# Patient Record
Sex: Female | Born: 1935 | Race: White | Hispanic: No | Smoking: Never smoker
Health system: Southern US, Community
[De-identification: ages and names within clinical notes are randomized; demographics above are authoritative.]

## PROBLEM LIST (undated history)

## (undated) DIAGNOSIS — I499 Cardiac arrhythmia, unspecified: Secondary | ICD-10-CM

## (undated) DIAGNOSIS — I4891 Unspecified atrial fibrillation: Secondary | ICD-10-CM

## (undated) DIAGNOSIS — T4145XA Adverse effect of unspecified anesthetic, initial encounter: Secondary | ICD-10-CM

## (undated) DIAGNOSIS — I272 Pulmonary hypertension, unspecified: Secondary | ICD-10-CM

## (undated) DIAGNOSIS — E785 Hyperlipidemia, unspecified: Secondary | ICD-10-CM

## (undated) DIAGNOSIS — I10 Essential (primary) hypertension: Secondary | ICD-10-CM

## (undated) DIAGNOSIS — I2510 Atherosclerotic heart disease of native coronary artery without angina pectoris: Secondary | ICD-10-CM

## (undated) DIAGNOSIS — F0390 Unspecified dementia without behavioral disturbance: Secondary | ICD-10-CM

## (undated) DIAGNOSIS — T8859XA Other complications of anesthesia, initial encounter: Secondary | ICD-10-CM

## (undated) HISTORY — PX: ORTHOPEDIC SURGERY: SHX850

## (undated) HISTORY — DX: Hyperlipidemia, unspecified: E78.5

## (undated) HISTORY — DX: Cardiac arrhythmia, unspecified: I49.9

## (undated) HISTORY — PX: ABDOMINAL HYSTERECTOMY: SHX81

## (undated) HISTORY — DX: Essential (primary) hypertension: I10

## (undated) HISTORY — DX: Atherosclerotic heart disease of native coronary artery without angina pectoris: I25.10

## (undated) HISTORY — PX: CORONARY ARTERY BYPASS GRAFT: SHX141

## (undated) HISTORY — DX: Unspecified atrial fibrillation: I48.91

---

## 1998-02-04 ENCOUNTER — Other Ambulatory Visit: Admission: RE | Admit: 1998-02-04 | Discharge: 1998-02-04 | Payer: Self-pay | Admitting: Obstetrics and Gynecology

## 1998-02-14 ENCOUNTER — Other Ambulatory Visit: Admission: RE | Admit: 1998-02-14 | Discharge: 1998-02-14 | Payer: Self-pay | Admitting: Cardiology

## 1998-02-22 ENCOUNTER — Ambulatory Visit (HOSPITAL_COMMUNITY): Admission: RE | Admit: 1998-02-22 | Discharge: 1998-02-22 | Payer: Self-pay | Admitting: Cardiology

## 1999-01-31 ENCOUNTER — Other Ambulatory Visit: Admission: RE | Admit: 1999-01-31 | Discharge: 1999-01-31 | Payer: Self-pay | Admitting: Obstetrics and Gynecology

## 1999-06-24 ENCOUNTER — Other Ambulatory Visit: Admission: RE | Admit: 1999-06-24 | Discharge: 1999-06-24 | Payer: Self-pay | Admitting: Obstetrics and Gynecology

## 1999-06-24 ENCOUNTER — Encounter (INDEPENDENT_AMBULATORY_CARE_PROVIDER_SITE_OTHER): Payer: Self-pay | Admitting: Specialist

## 2000-05-26 ENCOUNTER — Other Ambulatory Visit: Admission: RE | Admit: 2000-05-26 | Discharge: 2000-05-26 | Payer: Self-pay | Admitting: Obstetrics and Gynecology

## 2001-06-01 ENCOUNTER — Ambulatory Visit (HOSPITAL_COMMUNITY): Admission: RE | Admit: 2001-06-01 | Discharge: 2001-06-01 | Payer: Self-pay | Admitting: Cardiology

## 2001-12-26 ENCOUNTER — Other Ambulatory Visit: Admission: RE | Admit: 2001-12-26 | Discharge: 2001-12-26 | Payer: Self-pay | Admitting: Obstetrics and Gynecology

## 2002-01-10 ENCOUNTER — Encounter: Payer: Self-pay | Admitting: Obstetrics and Gynecology

## 2002-01-10 ENCOUNTER — Encounter: Admission: RE | Admit: 2002-01-10 | Discharge: 2002-01-10 | Payer: Self-pay | Admitting: Obstetrics and Gynecology

## 2004-01-07 ENCOUNTER — Other Ambulatory Visit: Admission: RE | Admit: 2004-01-07 | Discharge: 2004-01-07 | Payer: Self-pay | Admitting: Obstetrics and Gynecology

## 2004-01-10 ENCOUNTER — Encounter: Admission: RE | Admit: 2004-01-10 | Discharge: 2004-01-10 | Payer: Self-pay | Admitting: Obstetrics and Gynecology

## 2005-02-03 ENCOUNTER — Inpatient Hospital Stay (HOSPITAL_COMMUNITY): Admission: EM | Admit: 2005-02-03 | Discharge: 2005-02-11 | Payer: Self-pay | Admitting: Emergency Medicine

## 2005-02-03 ENCOUNTER — Encounter: Admission: RE | Admit: 2005-02-03 | Discharge: 2005-02-03 | Payer: Self-pay | Admitting: Cardiology

## 2005-04-13 ENCOUNTER — Encounter (HOSPITAL_COMMUNITY): Admission: RE | Admit: 2005-04-13 | Discharge: 2005-07-12 | Payer: Self-pay | Admitting: Cardiology

## 2005-07-13 ENCOUNTER — Encounter (HOSPITAL_COMMUNITY): Admission: RE | Admit: 2005-07-13 | Discharge: 2005-07-25 | Payer: Self-pay | Admitting: Cardiology

## 2007-01-18 ENCOUNTER — Other Ambulatory Visit: Admission: RE | Admit: 2007-01-18 | Discharge: 2007-01-18 | Payer: Self-pay | Admitting: Obstetrics and Gynecology

## 2007-02-17 ENCOUNTER — Inpatient Hospital Stay (HOSPITAL_COMMUNITY): Admission: EM | Admit: 2007-02-17 | Discharge: 2007-02-18 | Payer: Self-pay | Admitting: Emergency Medicine

## 2007-02-19 ENCOUNTER — Inpatient Hospital Stay (HOSPITAL_COMMUNITY): Admission: EM | Admit: 2007-02-19 | Discharge: 2007-02-23 | Payer: Self-pay | Admitting: Emergency Medicine

## 2007-02-20 ENCOUNTER — Encounter (INDEPENDENT_AMBULATORY_CARE_PROVIDER_SITE_OTHER): Payer: Self-pay | Admitting: Cardiology

## 2007-03-02 ENCOUNTER — Ambulatory Visit: Payer: Self-pay | Admitting: Vascular Surgery

## 2007-10-12 ENCOUNTER — Encounter: Payer: Self-pay | Admitting: Pulmonary Disease

## 2007-12-09 ENCOUNTER — Ambulatory Visit: Payer: Self-pay | Admitting: Pulmonary Disease

## 2007-12-09 DIAGNOSIS — R0602 Shortness of breath: Secondary | ICD-10-CM

## 2007-12-09 DIAGNOSIS — I10 Essential (primary) hypertension: Secondary | ICD-10-CM | POA: Insufficient documentation

## 2007-12-09 DIAGNOSIS — E785 Hyperlipidemia, unspecified: Secondary | ICD-10-CM

## 2007-12-16 ENCOUNTER — Encounter: Payer: Self-pay | Admitting: Pulmonary Disease

## 2007-12-22 ENCOUNTER — Ambulatory Visit (HOSPITAL_COMMUNITY): Admission: RE | Admit: 2007-12-22 | Discharge: 2007-12-22 | Payer: Self-pay | Admitting: Family Medicine

## 2007-12-22 ENCOUNTER — Encounter (INDEPENDENT_AMBULATORY_CARE_PROVIDER_SITE_OTHER): Payer: Self-pay | Admitting: Family Medicine

## 2007-12-22 ENCOUNTER — Ambulatory Visit: Payer: Self-pay | Admitting: Surgery

## 2007-12-23 ENCOUNTER — Ambulatory Visit: Payer: Self-pay | Admitting: Pulmonary Disease

## 2007-12-27 ENCOUNTER — Telehealth: Payer: Self-pay | Admitting: Pulmonary Disease

## 2007-12-28 DIAGNOSIS — I4891 Unspecified atrial fibrillation: Secondary | ICD-10-CM

## 2008-01-06 ENCOUNTER — Ambulatory Visit: Payer: Self-pay | Admitting: Pulmonary Disease

## 2008-03-14 ENCOUNTER — Ambulatory Visit: Payer: Self-pay | Admitting: Pulmonary Disease

## 2008-05-14 ENCOUNTER — Ambulatory Visit: Payer: Self-pay | Admitting: Pulmonary Disease

## 2008-05-17 ENCOUNTER — Telehealth (INDEPENDENT_AMBULATORY_CARE_PROVIDER_SITE_OTHER): Payer: Self-pay | Admitting: *Deleted

## 2008-06-06 ENCOUNTER — Ambulatory Visit: Payer: Self-pay | Admitting: Pulmonary Disease

## 2008-06-06 DIAGNOSIS — J4489 Other specified chronic obstructive pulmonary disease: Secondary | ICD-10-CM | POA: Insufficient documentation

## 2008-06-06 DIAGNOSIS — J449 Chronic obstructive pulmonary disease, unspecified: Secondary | ICD-10-CM

## 2008-12-13 ENCOUNTER — Inpatient Hospital Stay (HOSPITAL_COMMUNITY): Admission: EM | Admit: 2008-12-13 | Discharge: 2008-12-16 | Payer: Self-pay | Admitting: Emergency Medicine

## 2008-12-14 ENCOUNTER — Ambulatory Visit: Payer: Self-pay | Admitting: Vascular Surgery

## 2008-12-14 ENCOUNTER — Encounter (INDEPENDENT_AMBULATORY_CARE_PROVIDER_SITE_OTHER): Payer: Self-pay | Admitting: Internal Medicine

## 2010-11-15 ENCOUNTER — Encounter: Payer: Self-pay | Admitting: Orthopedic Surgery

## 2010-11-16 ENCOUNTER — Encounter: Payer: Self-pay | Admitting: Family Medicine

## 2011-02-10 LAB — DIFFERENTIAL
Basophils Absolute: 0 10*3/uL (ref 0.0–0.1)
Basophils Relative: 1 % (ref 0–1)
Eosinophils Absolute: 0.3 10*3/uL (ref 0.0–0.7)
Eosinophils Absolute: 0.4 10*3/uL (ref 0.0–0.7)
Eosinophils Relative: 3 % (ref 0–5)
Eosinophils Relative: 3 % (ref 0–5)
Lymphocytes Relative: 16 % (ref 12–46)
Lymphocytes Relative: 18 % (ref 12–46)
Lymphs Abs: 2.1 10*3/uL (ref 0.7–4.0)
Monocytes Absolute: 1 10*3/uL (ref 0.1–1.0)
Monocytes Relative: 9 % (ref 3–12)
Neutrophils Relative %: 71 % (ref 43–77)

## 2011-02-10 LAB — URINALYSIS, ROUTINE W REFLEX MICROSCOPIC
Hgb urine dipstick: NEGATIVE
Nitrite: NEGATIVE
Specific Gravity, Urine: 1.014 (ref 1.005–1.030)
Urobilinogen, UA: 1 mg/dL (ref 0.0–1.0)

## 2011-02-10 LAB — BASIC METABOLIC PANEL
BUN: 14 mg/dL (ref 6–23)
BUN: 7 mg/dL (ref 6–23)
BUN: 9 mg/dL (ref 6–23)
Calcium: 9.2 mg/dL (ref 8.4–10.5)
Calcium: 9.3 mg/dL (ref 8.4–10.5)
Chloride: 106 mEq/L (ref 96–112)
Creatinine, Ser: 0.8 mg/dL (ref 0.4–1.2)
GFR calc Af Amer: >60 mL/min (ref >60)
GFR calc Af Amer: >60 mL/min (ref >60)
GFR calc non Af Amer: >60 mL/min (ref >60)
GFR calc non Af Amer: >60 mL/min (ref >60)
GFR calc non Af Amer: >60 mL/min (ref >60)
Glucose, Bld: 130 mg/dL — ABNORMAL HIGH (ref 70–99)
Potassium: 3.7 mEq/L (ref 3.5–5.1)
Sodium: 139 mEq/L (ref 135–145)

## 2011-02-10 LAB — COMPREHENSIVE METABOLIC PANEL
ALT: 20 U/L (ref 0–35)
AST: 25 U/L (ref 0–37)
Albumin: 3.5 g/dL (ref 3.5–5.2)
BUN: 10 mg/dL (ref 6–23)
Calcium: 9.1 mg/dL (ref 8.4–10.5)
Chloride: 104 mEq/L (ref 96–112)
Creatinine, Ser: 0.71 mg/dL (ref 0.4–1.2)
GFR calc non Af Amer: >60 mL/min (ref >60)
Sodium: 139 mEq/L (ref 135–145)

## 2011-02-10 LAB — CBC
HCT: 36.3 % (ref 36.0–46.0)
MCHC: 33 g/dL (ref 30.0–36.0)
Platelets: 277 10*3/uL (ref 150–400)
RDW: 14.2 % (ref 11.5–15.5)
WBC: 10.2 10*3/uL (ref 4.0–10.5)
WBC: 11.6 10*3/uL — ABNORMAL HIGH (ref 4.0–10.5)

## 2011-02-10 LAB — LIPID PANEL
Cholesterol: 136 mg/dL (ref 0–200)
Total CHOL/HDL Ratio: 2.6 RATIO
VLDL: 34 mg/dL (ref 0–40)

## 2011-02-10 LAB — CARDIAC PANEL(CRET KIN+CKTOT+MB+TROPI)
CK, MB: 2.4 ng/mL (ref 0.3–4.0)
CK, MB: 2.8 ng/mL (ref 0.3–4.0)
Relative Index: INVALID (ref 0.0–2.5)
Total CK: 75 U/L (ref 7–177)
Troponin I: <0.01 ng/mL (ref 0.00–0.06)

## 2011-02-10 LAB — MAGNESIUM: Magnesium: 2.3 mg/dL (ref 1.5–2.5)

## 2011-02-10 LAB — TSH: TSH: 3.816 u[IU]/mL (ref 0.350–4.500)

## 2011-02-10 LAB — URINE MICROSCOPIC-ADD ON

## 2011-02-10 LAB — PROTIME-INR
INR: 1.3 (ref 0.00–1.49)
INR: 2.4 — ABNORMAL HIGH (ref 0.00–1.49)
INR: 2.6 — ABNORMAL HIGH (ref 0.00–1.49)
Prothrombin Time: 27.3 seconds — ABNORMAL HIGH (ref 11.6–15.2)
Prothrombin Time: 27.4 seconds — ABNORMAL HIGH (ref 11.6–15.2)
Prothrombin Time: 27.7 seconds — ABNORMAL HIGH (ref 11.6–15.2)
Prothrombin Time: 29.7 seconds — ABNORMAL HIGH (ref 11.6–15.2)

## 2011-03-10 NOTE — Discharge Summary (Signed)
[name], [name]                 ACCOUNT NO.:  [id number]   MEDICAL RECORD NO.:  [id number]          PATIENT TYPE:  INP   LOCATION:  1401                         FACILITY:  [facility]   PHYSICIAN:  Ramiro Harvest, MD    DATE OF BIRTH:  [birth year]   DATE OF ADMISSION:  12/13/2008  DATE OF DISCHARGE:  12/16/2008                               DISCHARGE SUMMARY   PRIMARY CARE PHYSICIAN:  Dr. Merlene Laughter.   DISCHARGE DIAGNOSES:  1. Questionable probable presyncope.  2. Hypertension.  3. Hyperlipidemia.  4. Coronary artery disease, status post coronary artery bypass      grafting April 2006.  5. Anemia.  6. Atrial fibrillation.  7. Restless leg syndrome.  8. Depression/anxiety.  9. History of hearing loss.  10.History of hypothyroidism but currently on no medications.  11.Prior history of a hematuria.  12.History of neck pain.  13.On chronic Coumadin therapy.  14.Status post hysterectomy.  15.Status post bladder attack.  16.Status post left knee arthroscopy.  17.Fall.   DISCHARGE MEDICATIONS:  1. Coumadin 1 mg on Monday, Wednesdays, and Fridays and 2 mg on      Tuesdays, Thursdays, Saturdays, and "Sundays.  2. Aspirin 81 mg p.o. daily.  3. Crestor 20 mg p.o. daily  4. Toprol XL 25 mg p.o. daily.  5. Altace 5 mg p.o. daily.  6. Calcium 630 mg p.o. daily.  7. Vitamin D 1.25 mg two times a week.  8. Lasix 20 mg p.o. daily.  9. Iron sulfate 325 mg p.o. daily.  10.Multivitamin 1 tablet p.o. daily.  11.Requip 0.25 mg p.o. daily.  12.Sertraline 75 mg p.o. daily   DISPOSITION AND FOLLOWUP:  The patient will be discharged home with home  health PT, OT and R.N.  The patient will be staying with the family who  can provide 24-hour observation/care.  The patient is to follow up with  her PCP as scheduled.  On followup, the patient's 2-D echo which was  done in-house will need to be followed up on.  The patient's blood  pressure needs to be reassessed.  The patient is to follow up  with her  cardiologist Dr. Traci Turner in the next 1-2 weeks.   CONSULTATIONS:  None.   PROCEDURES PERFORMED:  1. A CT of the head and C-spine without contrast was done on February      18" , 2010 that showed no acute intracranial abnormality.  No acute traumatic injury identified.  No acute fracture or listhesis  identified in the cervical spine.  Ligamentous injury is not included.  Mild focal kyphosis at C7-T1.  Straightening of cervical lordosis might  positional, degenerative or reflect muscle spasm or soft tissue  ligamentous injury.  Multilevel degenerative changes including facet  neuropathy and disk degeneration.  Calcified atherosclerosis in the  neck.  1. Plain films of the left ankle done on December 13, 2008 showed      osteopenia without acute bony abnormality.  2. Chest x-ray done on December 13, 2008 is a stable exam, emphysema      and cardiomegaly without acute cardiopulmonary process.  4.  Carotid Dopplers were done on December 14, 2008 which showed      bilateral mild technical difficulty due to what appears to be      moderate restless leg syndrome causing involuntary movement and      focal interference.  Tortuosity of the vessels throughout.  The      right had mild intimal wall changes of the CCA.  Mild focal      calcific plaque was noted at the bifurcation.  At the near and far      walls acoustic shadowing at this point.  Mild calcific and      noncalcific plaque was noted along the near wall of the ICA.      Heterogeneous plaque noted in the ECA.  There is 40-60% proximal      ICA stenosis. Mid range acoustic shadowing main obscure on higher      velocities.  On the left, mild intimal wall changes of the CCA.      Mild calcific plaque noted along near and the far walls of the      bifurcation.  No significant plaque noted in the ICA or he ECA.      Severely tortuous ICA with no evidence of kinking or knotting.      There is a 40% to 60% proximal ICA  stenosis, lower end of the      scale.  Vertebral artery flow is antegrade bilaterally.  These      carotid Dopplers were done on December 14, 2008.  3. 2-D echo was done on December 14, 2008 and was pending at the time      of discharge.  This will need to be followed up on per PCP and her      cardiologist.   BRIEF ADMISSION HISTORY AND PHYSICAL:  [name] is an 77 year old  white female with a history of coronary artery disease, status post  CABG, history of hyperlipidemia, hypertension, AFib, on chronic Coumadin  therapy, and history of depression and anxiety who presented to the ED  with a probable presyncopal episode.  Per patient and daughter, the  patient has had some episodes of feeling of something common over her  leading to a disconnect for a few seconds which has been occurring for  over several years, however, this has been occurring  as recently as 1  day prior to admission.  During these episodes, the patient has stated  that she had to sit down and it usually passes away.  The patient denied  any syncope.  No fever, no chills, no chest pain or shortness of breath,  no nausea or vomiting, no abdominal pain, no headache, no diarrhea, no  constipation, no focal neurological symptoms.  No cough, no dysuria.  The patient also does endorse some decreased p.o. intake.  One day prior  to admission, the patient had an episode of dyspnea which occurred and  the patient fell on the right side.  The patient denied slipping, denied  any syncope and denied tripping on herself.  She stated that she just  fell.  The patient was able to call on the phone and spoke to her  daughter who right approximately 12 minutes later and helped her  mother  off the floor.  The patient did okay and was able to ambulate without  any complaints of any pain after that.  The patient's daughter then  called cardiologist's office who sent the patient to the ED.  per  daughter, the patient has had  similar symptoms prior to having her CABG.  In the ED head CT was done.  C-spine films also done which were  negative.  CBC was within normal limits.  Comprehensive metabolic  profile was within normal limits.  Chest x-ray was negative.  EKG showed  some nonspecific EKG changes and we were called to admit the patient for  further evaluation and management.   PHYSICAL EXAMINATION ON ADMISSION:  Temperature of 97.3, blood pressure  139/58, pulse of 69, respiratory rate 20, satting 93% on room air.  Orthostatics lying were 131/53 with a pulse of 62, sitting 134/61 with a  pulse of 60, standing 128/76 with a pulse of 63.  GENERAL: The patient in no apparent distress.  HEENT: Normocephalic, atraumatic.  PERRLA.  EOMI.  Oropharynx was clear.  No lesions.  No exudates.  Neck is supple.  No lymphadenopathy.  Mildly  dry mucous membranes.  RESPIRATORY:  Lungs are clear to auscultation bilaterally.  No wheezes,  no crackles, no rhonchi.  CARDIOVASCULAR:  Regular rate and rhythm with a soft 2/6 systolic  ejection murmur.  ABDOMEN:  Soft, nontender, nondistended, positive bowel sounds.  EXTREMITIES: No clubbing, cyanosis or edema.  The patient does have a  bruise on the right upper extremity.  NEUROLOGICAL;  The patient was alert and oriented x3.  Cranial nerves II-  XII are grossly intact.  No focal deficits.   ADMISSION LABORATORY DATA:  CBC:  White count 10.2, hemoglobin 11.7,  platelets 282, hematocrit 35.5, ANC of 7.2.  PT of 27.7, INR 2.4.  Sodium 139, potassium 3.7, chloride 104, bicarb 28, BUN 10, creatinine  0.71, glucose of 104, calcium of 9.1, bilirubin 0.8, alk phosphatase 62,  AST 25, ALT 20, albumin 3.5, protein 5.5.  UA was yellow, clear,  specific gravity 1.014, pH of 6.5, glucose negative, bilirubin negative,  ketones negative, blood negative, protein negative, urobilinogen 1,  nitrite negative, leukocytes small.  Urine microscopy: WBCs 0-2, RBCs 0-  2.  CK of 80, CK-MB 2.5,  troponin-I of 0.02.  Chest x-ray with emphysema  cardiomegaly without acute  cardiopulmonary process.  Plain films of the left ankle as stated above.  CT of the head and C-spine as stated above stated above.   HOSPITAL COURSE:  Probable questionable presyncope.  The patient was  admitted with what was felt like a probable presyncopal episode leading  to her falls.  On initial presentation, it was a broad differential  including cardiogenic versus neurocardiogenic versus orthostatic versus  neurological.  The patient was admitted.  Cardiac enzymes were cycled  which came back negative.  Carotid Dopplers were obtained.  We will with  results as stated above which had no significant stenosis.  A 2-D echo  was obtained with results pending which will need to be followed up per  PCP.  The patient remained stable.  A random cortisol level was obtained  which was within normal limits.  A TSH was also obtained which was  within normal limits.  The patient was placed on very gentle hydration.  Her Lasix was held for 2 days during the hospitalization and restarted  prior to discharge.  The patient remained stable.  Workup obtained was  negative to date.  The patient was in stable condition .  On 11/2008 in  the early hours of the morning, blood pressure obtained on the patient  was in the 180s approximately and a systolic of about 185 at which point  in time the patient had told the nurse that she had similar symptoms to  what she had described on presentation which improved significantly.  The patient did not have any more symptoms.  It was felt that the  patient's symptoms were likely secondary to uncontrolled blood pressure.  The patient has been forgetful recently per family and lives alone and  as such may have forgotten to take a blood pressure medications from  time to time.  The patient remained asymptomatic throughout the rest of  the hospitalization and the patient will be discharged in a  stable  condition.  The patient will be discharged home with home health PT, OT  and R.N. as well.  The patient is to follow up with PCP as scheduled for  any further evaluation and also follow up with cardiology for any  further evaluation.  The patient during the hospitalization was placed  on the telemetry monitor.  The patient remained in a normal sinus rhythm  throughout the hospitalization and will be discharged in stable  condition.   The rest of the patient's chronic medical issues remained stable  throughout the hospitalization and the patient will be discharged in  stable and improved condition.   On the day of discharge, vital signs were temperature 97.5, pulse of 75,  blood pressure 138/71, respiratory rate 17, satting 96% on room air.   Sodium 136, potassium 3.6, chloride 101, bicarb 27, BUN 14, creatinine  0.80, glucose of 134, calcium of 9.3, PT of 27.3, INR of 2.4.   It was a pleasure taking care of Ms. Arnelle Deak.      Ramiro Harvest, MD  Electronically Signed     DT/MEDQ  D:  12/16/2008  T:  12/16/2008  Job:  [id number]   cc:   Hal T. Stoneking, M.D.  Fax: [phone]   Armanda Magic, M.D.  Fax: [phone]

## 2011-03-10 NOTE — H&P (Signed)
[name], [name]                 ACCOUNT NO.:  [id number]   MEDICAL RECORD NO.:  [id number]          PATIENT TYPE:  EMS   LOCATION:  ED                           FACILITY:  [facility]   PHYSICIAN:  Ramiro Harvest, MD    DATE OF BIRTH:  [birth year]   DATE OF ADMISSION:  12/13/2008  DATE OF DISCHARGE:                              HISTORY & PHYSICAL   The patient's primary care physician is Dr. Merlene Laughter of [facility].   HISTORY OF PRESENT ILLNESS:  [name] is an 77 year old white female  with a history of coronary artery disease status post CABG, history of  hyperlipidemia, hypertension, Afib on chronic Coumadin therapy, history  of depression and anxiety who presented to the ED with probable  presyncopal episode. Per patient and daughter the patient has had some  episodes of feeling something coming over her leading to disconnect  for a few seconds which has been occurring over several years.  However,  or has been occurring as recently as 1 day prior to admission. During  these episodes the patient states that she has to sit down and this  passes away.  The patient denies any syncope, no fever, no chills, no  chest pain or shortness of breath, no nausea or vomiting.  No abdominal  pain, no headache, no diarrhea, no constipation.  No focal neurological  symptoms.  No cough, no dysuria.  The patient also does endorse some  decreased p.o. intake. One day prior to admission the patient had an  episode of dyspnea which occurred, and the patient fell on the right  side.  The patient denies slipping. Denies any syncope and denies  tripping on herself.  She stated that she just fell.  The patient was  able to call to the phone, call her daughter who arrived approximately  12 minutes later, and helped the mother off the floor.  The patient did  okay, was able to ambulate with no complaints of any pain.  The  patient's daughter called the cardiologist's office who sent the  patient  to the ED. Per daughter the patient has had similar symptoms just prior  to having her CABG.  In the ED head CT done and C-spine films done were  negative.  CBC was within normal limits.  Comprehensive metabolic  profile was within normal limits.  Chest x-ray was negative.  EKG showed  some nonspecific EKG changes. We were called to admit the patient for  further evaluation and management.   ALLERGIES:  ERYTHROMYCIN.   PAST MEDICAL HISTORY:  1. A history of known coronary artery disease, previous coronary      artery bypass graft in April 2006 with mammary graft to the LAD,      vein graft sequentially to the first and second marginal, vein      graft sequentially to the posterior descending and posterior      lateral branches. Also concern at that time for possible postop A      fib.  2. She is on chronic Coumadin therapy.  3. History of hematuria.  4. History of neck pain.  5. History of hypothyroidism but currently on no medications.  6. Hypertension.  7. Hyperlipidemia.  8. History of atrial fibrillation.  9. History of anemia.  10.History of hearing loss.  11.Restless leg syndrome.  12.Depression/anxiety.  13.Status post hysterectomy.  14.Status post bladder tack.  15.Status post left knee arthroscopy.   HOME MEDICATIONS:  1. Coumadin 1 mg Monday, Wednesday, Friday and 2 mg on Tuesday,      Thursday, Saturday, and Sunday.  2. Aspirin 81 mg p.o. daily.  3. Crestor 20 mg p.o. daily.  4. Toprol XL 25 mg p.o. daily.  5. Altace 5 mg p.o. daily.  6. Calcium 630 mg daily.  7. Vitamin D 1.25 mg 2 times a week.  8. Lasix 20 mg p.o. daily  9. Iron sulfate 325 mg p.o. daily.  10.Multivitamin p.o. daily.  11.Requip 0.25 mg p.o. daily.  12.Sertraline 75 mg p.o. daily   SOCIAL HISTORY:  The patient lives alone.  She denies any tobacco use.  No alcohol use.  No IV drug use.  The patient has 3 daughters and lives  by herself in [place].  The patient is widowed.    FAMILY HISTORY:  Father deceased at age 79 from stroke.  Mother deceased  at age 43 from a heart attack.  The patient does have a sister with  coronary artery disease.   REVIEW OF SYSTEMS:  As per HPI, otherwise negative.   PHYSICAL EXAM:  VITAL SIGNS:  Temperature 97.3, blood pressure 139/58,  pulse of 69, respiratory rate 20, satting 93% on room air.  Orthostatics  lying 131/53 with a pulse of 62. Sitting up 134/61 with a pulse of 60.  Standing up 128/76 with a pulse of 63.  GENERAL:  Patient is in no apparent distress.  HEENT: Normocephalic, atraumatic.  Pupils equal, round and reactive to  light and accommodation.  Extraocular movements intact.  Oropharynx is  clear.  No lesions, no exudates.  Neck is supple.  No lymphadenopathy.  Mildly dry mucous membranes.  RESPIRATORY:  Lungs are clear to auscultation bilaterally.  No wheezes,  no crackles.  No rhonchi.  CARDIOVASCULAR: Regular rate and rhythm with  a soft 2/6 systolic ejection murmur.  ABDOMEN: Soft, nontender, nondistended.  Positive bowel sounds.  EXTREMITIES: No clubbing, cyanosis or edema.  The patient does have a  bruise on her right upper extremity.  NEUROLOGICAL:  The patient is alert and oriented x3.  Cranial nerves II-  XII are grossly intact.  No focal deficits.   LABS:  CBC white count 10.2, hemoglobin 11.7, platelets of 282,  hematocrit 35.5, ANC of 7.2. PT of 27.7, INR of 2.4. Sodium 139,  potassium 3.7, chloride 104, bicarb 28, BUN 10, creatinine 0.71, glucose  of 104, calcium of 9.1. Bilirubin 0.8, alk phosphatase 62, AST 25, ALT  20, albumin 3.5, protein 5.5.  UA was yellow, clear. Specific gravity  1.014, pH of 6.5, glucose negative, bilirubin negative, ketones  negative, blood negative, protein negative, urobilinogen 1, nitrite  negative, leukocytes small. Urine microscopy WBC 0-2, RBCs 0-2. CK of  80, CK-MB 2.5, troponin I of 0.02.  Chest x-ray with emphysema and  cardiomegaly without acute cardiopulmonary  process.  Plain films of the  left ankle show osteopenia without any acute abnormality.  CT of the  head and C-spine of the neck shows no acute intracranial abnormality.  No acute traumatic injury identified. No acute fracture or listhesis  defined in the cervical spine.  Ligamentous injury is not excluded. Mild  focal kyphosis C7 through T1, straightening of the cervical lordosis,  may be positional, degenerative, reflect muscle spasm or soft tissue  ligamentous injury, multilevel degenerative changes including facet  arthropathy and disk degeneration, calcified atherosclerosis in the  neck.   ASSESSMENT AND PLAN:  [name] is an 77 year old female with a  history of coronary artery disease status post coronary artery bypass  graft, history of hypertension, hyperlipidemia, depression and anxiety  presenting to the ED with a probable presyncopal event.  1. Probable presyncope, questionable etiology.  Differential includes      cardiogenic versus neurocardiogenic versus orthostatic versus      neurological. (Head CT is negative.  No focal neurological      findings.) Also versus aortic insufficiency.  Will admit the      patient to telemetry.  Cycle cardiac enzymes every 8 hours x3.      Check a 2-D echo to rule out LV dysfunction and aortic stenosis.      Check carotid Dopplers to rule out carotid artery stenosis.  Check      a TSH.  Check a random cortisol level. Will hold Lasix for now and      monitor and place on gentle IV hydration.  2. Coronary artery disease status post coronary artery bypass graft.      The patient is asymptomatic.  However, the patient with nonspecific      ST-T wave changes on EKG.  Cycle cardiac enzymes.  Check a 2-D      echo.  Continue home dose aspirin, Toprol, Altace, Coumadin and      Crestor hold Lasix for now.  3. Hyperlipidemia.  Check a fasting lipid panel. Crestor.  4. Hypertension.  Continue Altace and Toprol.  5. Anemia.  H and H are  stable.  Continue iron sulfate.  6. Atrial fibrillation, currently in normal sinus rhythm. Toprol for      rate control. Coumadin for anticoagulation.  7. Restless leg syndrome.  Requip.  8. Depression/anxiety.  Continue home dose sertraline.  9. Prophylaxis. Protonix for GI prophylaxis.  Coumadin for      anticoagulation.   It has been a pleasure taking care of [name].      Ramiro Harvest, MD  Electronically Signed     DT/MEDQ  D:  12/13/2008  T:  12/13/2008  Job:  [id number]   cc:   Hal T. Stoneking, M.D.  Fax: [phone]   Armanda Magic, M.D.  Fax: [phone]

## 2011-03-13 NOTE — Discharge Summary (Signed)
[name], [name]                 ACCOUNT NO.:  [id number]   MEDICAL RECORD NO.:  [id number]          PATIENT TYPE:  INP   LOCATION:  3737                         FACILITY:  [facility]   PHYSICIAN:  [name] Face Muse, PA    DATE OF BIRTH:  1923/10/30   DATE OF ADMISSION:  02/17/2007  DATE OF DISCHARGE:  02/18/2007                               DISCHARGE SUMMARY   PRIMARY CARE PHYSICIAN:  [name] Hazel, MD   ADMISSION DIAGNOSIS:  Chest pain.   DISCHARGE DIAGNOSES:  1. Chest pain, resolved.  MI ruled out with negative serial cardiac      enzymes x2.  2. Coronary artery disease status post coronary artery bypass graft,      with a mammary graft to the left anterior descending, vein graft      sequentially to the first and second marginal, vein graft      sequentially to the posterior descending and posterior lateral      branches.  3. History of atrial fibrillation, now maintaining normal sinus      rhythm.  4. Systemic anticoagulation with Coumadin therapy.  5. Hypertension, controlled.  6. Dyslipidemia.  7. Hypothyroidism.  8. Status post hysterectomy.  9. Status post bladder tack.  10.Status post knee arthroscopy.   CONSULTATIONS:  None.   PROCEDURES:  None.   HOSPITAL COURSE:  [name] is an 77 year old Caucasian female with a  known history of coronary artery disease, status post CABG as outlined  above.  She was admitted to the [facility] on [facility] 17, 2003,  secondary to chest pain.  A 12-lead EKG revealed normal sinus rhythm  with 78 beats per minute.  There were no acute ischemic changes.  Serial  cardiac enzymes were negative x2 with a peak troponin of 0.01.  Chest x-  ray revealed no acute cardiopulmonary disease.  The patient was  continued on Coumadin, as well as aspirin.  Her enzymes and EKG were  followed overnight.  She was kept n.p.o. after midnight and had no  further complaints of chest pain, shortness of breath, nausea, vomiting,  diaphoresis, or  dizziness.  She was discharged to home in stable  condition, following her second set of negative serial enzymes.  She was  scheduled for an outpatient stress Cardiolite at [facility], for  further evaluation of chest pain to rule out ischemia.   HOME MEDICATIONS:  Were continued upon discharge.  She was seen and  examined by Dr. Carolanne Rivas prior to discharge, who is in agreement  with the discharge decision.   LABORATORY DATA:  Serial cardiac enzymes:  CK total 63 and 58, CK-MB 1.8  and 1.7, troponin-I 0.01 x2.  Point of care enzymes:  Myoglobin 72.4, CK-  MB 1.0, troponin-I less than 0.05, TSH 1.802, sodium 135, potassium 4.4,  chloride 105, CO2 27, glucose 120, BUN 9, creatinine 0.66, total  bilirubin 0.6, alkaline phosphatase 71, AST 22, ALT 18, total protein  6.2, serum albumin 3.7, calcium, PT 33.1, INR 3, hemoglobin 12.9,  hematocrit 38.   X-RAYS:  As stated in the hospital  course.   EKG:  As stated in the hospital course.   CONDITION ON DISCHARGE:  Stable.   DISCHARGE MEDICATIONS:  1. Lanoxin 0.125 mg daily.  2. Altace 2.5 mg daily.  3. Toprol XL 50 mg daily.  4. Warfarin 2 mg as directed.  5. Aspirin 81 mg daily.  6. Crestor 40 mg daily.  7. Sublingual nitroglycerin 0.4 mg as needed for chest pain.  This      represented a new prescription.  A prescription was given with      refills   DISCHARGE INSTRUCTIONS:  1. No restrictions upon activity.  2. Continue a low-sodium, low-fat, and low-carbohydrate, as well as      low cholesterol diet.  3. Avoid straining.  4. Stop any activity that causes chest pain, shortness of breath,      dizziness, sweating, or excessive weakness.   FOLLOW-UP ARRANGEMENTS:  Stress Cardiolite at [facility]ology on April  29 at 9:30 a.m., to rule out ischemia.      [name], [place]     RDM/MEDQ  D:  03/28/2007  T:  03/28/2007  Job:  [id number]   cc:   [name], M.D.  [name], M.D.

## 2011-03-13 NOTE — H&P (Signed)
[name], [name]              ACCOUNT NO.:  [id number]   MEDICAL RECORD NO.:  [id number]          PATIENT TYPE:  INP   LOCATION:  1824                         FACILITY:  [facility]   PHYSICIAN:  Colleen Can. Deborah Chalk, M.D.DATE OF BIRTH:  04-Aug-1924   DATE OF ADMISSION:  02/19/2007  DATE OF DISCHARGE:                              HISTORY & PHYSICAL   CHIEF COMPLAINT:  Chest pain.   HISTORY OF PRESENT ILLNESS:  [name] is an 77 year old white female.  She has multiple medical problems.  She was just discharged from the  hospital on Thursday after being admitted overnight with neck pain.  There was concern for possible cardiac involvement.  Apparently, she had  a negative evaluation and was sent home.  Early this morning, she  presents back to the emergency room with complaining of a rapid heart  rate.  She feels like her heart is going to pop out of her chest.  By  the time of her arrival, she has had a duration of approximately 90  minutes.  Here in the emergency room, she is noted to be in atrial  fibrillation with rapid ventricular response.  She has currently been  placed on IV Cardizem drip.  She is subsequently admitted for further  evaluation.   PAST MEDICAL HISTORY:  1. Known atherosclerotic cardiovascular disease.  She had previous      coronary artery bypass grafting in April 2006 with mammary graft to      the LAD, vein graft sequentially to the first and second marginal,      vein graft sequentially to the posterior descending and      posterolateral branches.  There is concern for possible      postoperative atrial fibrillation.  2. Chronic Coumadin.  3. Recent hematuria.  4. Recent admission for neck pain.  5. Hypothyroidism but currently no longer on replacement.  6. Hypertension.  7. Hyperlipidemia.   SURGICAL HISTORY:  Includes hysterectomy, bladder tack up,  knee  arthroscopy and bypass surgery.   CURRENT MEDICINES:  1. Coumadin 2 mg a day.  2. Baby  aspirin daily.  3. Crestor 40 mg a day.  4. Toprol XL 50 mg.  5. Lanoxin 0.125 daily.   ALLERGIES:  ERYTHROMYCIN.   FAMILY HISTORY:  Father died at 5 of a stroke.  Mother died at age 49  of heart attack.  She does have a sister that has coronary disease as  well.   SOCIAL HISTORY:  She has three daughters.  She does not smoke or use  alcohol to access.  She currently lives alone.   REVIEW OF SYSTEMS:  Is as in the dictation per Dr. Viann Rivas on  February 17, 2007 and is unchanged.   On exam, she is pleasant.  She is currently in no acute distress.  Her  blood pressure is 145/81, her heart rate is 144, respiratory rate is 22.  She is afebrile.  O2 saturation is 96%.  SKIN:  Is warm and dry.  Color is unremarkable.  LUNGS:  Are basically clear.  HEART:  Shows irregular and tachycardia rhythm.  ABDOMEN:  Soft. She does have a median sternotomy scar noted.  She has no significant lower extremity edema.   LABORATORY DATA:  On admission, her EKG shows atrial fibrillation with  rapid ventricular response.  This is new from the previous tracing done  April 25 and April 24 of this year.   Sodium 136, potassium 4.1, chloride 109, BUN 12, creatinine is 0.8,  glucose 134.  CBC shows a white count of 12.4, hemoglobin 13, hematocrit  40, platelets 336.  Cardiac markers are negative x1.  D-dimer is  negative at 0.30.  INR is therapeutic at 2.8.  Digoxin level is  subtherapeutic at 0.4.  Urinalysis is unremarkable except for a small  amount of leukocyte esterase.   OVERALL IMPRESSION:  1. Atrial fibrillation with rapid ventricular response.  2. Known ischemic heart disease.  3. Coumadin therapy.  4. Hypertension.  5. Hyperlipidemia.   PLAN:  She is admitted.  She will be placed on IV Cardizem for rate  control.  Will increase her beta blocker.  It appears that she may have  had workup in order to try to get her off of Coumadin and so for now  will hold off on ordering 2-D  echocardiogram.  This was certainly may  have already been done as an outpatient.  She will be admitted to the  service of Dr. Armanda Magic with further treatment plan to follow.      Sharlee Blew, N.P.      Colleen Can. Deborah Chalk, M.D.  Electronically Signed    LC/MEDQ  D:  02/19/2007  T:  02/19/2007  Job:  [id number]   cc:   Colleen Can. Deborah Chalk, M.D.  Armanda Magic, M.D.

## 2011-03-13 NOTE — H&P (Signed)
[name], [name] NO.:  [id number]   MEDICAL RECORD NO.:  [id number]          PATIENT TYPE:  EMS   LOCATION:  MAJO                         FACILITY:  [facility]   PHYSICIAN:  [name], M.D.DATE OF BIRTH:  [birth year]   DATE OF ADMISSION:  02/17/2007  DATE OF DISCHARGE:                              HISTORY & PHYSICAL   REASON FOR ADMISSION:  Neck pain.   HISTORY:  This 77 year old female has a history of coronary artery  disease with coronary artery bypass grafting done for three-vessel  coronary artery disease in April of 2006.  At that time she had a  totally occluded diagonal, 80% circumflex, and an 80% proximal right and  95% distal right coronary artery stenosis.  She underwent coronary  artery bypass grafting by Salvatore Decent. Dorris Fetch, M.D. with a mammary  graft to the LAD, vein graft sequentially to the first and second  marginal, a vein graft sequentially to the posterior descending and  posterolateral branches.  Some time following bypass grafting she was  placed on Coumadin.  It may have been for atrial fibrillation, but the  family and the patient cannot be very specific about that.  They mention  that she may be wearing a monitor to try to get her off of Coumadin.  She recently has had some urinary bleeding thought due to Coumadin and  an interaction of Coumadin with a steroid preparation given to her.  She  went to the [facility] tonight with the complaint of left neck  pain described as a tightness or pulling pain that then became right-  sided and accompanied with her right shoulder.  It was described as  pressure-type discomfort with some radiation to the jaw and a feeling of  fullness behind her eyes.  She had some mild shortness of breath with  it.  She was given oxygen, two aspirin tablets, and nitroglycerin  tablets and her pain went away after about 10 minutes and she was  advised to come to the [facility] emergency  room.  The sheet  from the [facility] said to rule out an MI.  The patient  really did not have any chest discomfort.  She has diffuse T wave  changes, but these have been noted previously.  Her blood pressure was  mildly elevated there.   PAST MEDICAL HISTORY:  Remarkable for hypothyroidism, but she is no  longer taking thyroid hormones.  She has a history of hypertension and  hyperlipidemia and a previous history of some mild hematuria.   PAST SURGICAL HISTORY:  Hysterectomy, bladder tack up, knee arthroscopy,  and bypass grafting x5.   CURRENT MEDICATIONS:  1. Warfarin 2 mg daily.  2. Aspirin 81 mg daily.  3. Crestor 40 mg daily.  4. Toprol XL 50 mg daily.  5. Lanoxin 0.125 mg daily.   ALLERGIES:  ERYTHROMYCIN.   FAMILY HISTORY:  Father died at 72 of a stroke.  Mother died at age 3  of MI.  Sister has coronary disease.   SOCIAL HISTORY:  She has three daughters.  She does  not smoke or use  alcohol to excess.  She currently lives alone.   REVIEW OF SYSTEMS:  She has had bilateral cataract extractions  previously.  She has mild decreased hearing.  She has no difficulty  swallowing, some mild intermittent constipation has been noted.  She has  had some rectal bleeding thought due to hemorrhoids in the past.  She  has mild arthritis involving her left knee.  She normally can do her  activities and does not have any overt complaints with that.  Other than  is noted above the remainder of the review of systems is unremarkable.   PHYSICAL EXAMINATION:  GENERAL:  She is a pleasant female appearing  younger than her stated age with dark hair.  VITAL SIGNS:  Blood pressure currently 140/80, pulse currently 70 and  regular.  SKIN:  Warm and dry.  HEENT:  EOMI, PERRLA, sclerae clear, fundi not examined.  Oropharynx  negative.  NECK:  Supple without masses, JVD, thyromegaly, or bruits.  LUNGS:  Clear to A&P.  HEART:  Normal S1 and S2, no S3 or murmur.  ABDOMEN:  Soft  and nontender.  Distal pulses 2+.  Median sternotomy scar  is noted.  There is no tenderness in the shoulder or the neck.   LABORATORY DATA:  Initial point-of-care enzymes were negative.   Chest x-ray is unremarkable.   EKG shows diffuse T wave changes.   IMPRESSION:  1. Atypical left neck and shoulder pain.  Frankly the pain sounds      atypical for myocardial ischemia, however, with the diffuse T wave      changes I would recommend admission to rule out myocardial      infarction.  2. Coronary artery disease with previous bypass grafting 2 years ago.  3. Recent hematuria.  4. Coumadin treatment for uncertain reasons, possible atrial      fibrillation in the past which is paroxysmal.  5. Hyperlipidemia under treatment.  6. Hypertension under treatment.   RECOMMENDATIONS:  Admit and continue Coumadin.  Continue aspirin.  Check  serial enzymes and EKG.  Keep NPO after midnight for further workup if  asked by Dr. Mayford Knife.      [name], M.D.  Electronically Signed     WST/MEDQ  D:  02/17/2007  T:  02/18/2007  Job:  [id number]   cc:   Sigmund Hazel, M.D.  Armanda Magic, M.D.

## 2011-03-13 NOTE — Cardiovascular Report (Signed)
[name], [name]                 ACCOUNT NO.:  [id number]   MEDICAL RECORD NO.:  [id number]          PATIENT TYPE:  INP   LOCATION:  1847                         FACILITY:  [facility]   PHYSICIAN:  Armanda Magic, M.D.     DATE OF BIRTH:  02-28-1924   DATE OF PROCEDURE:  02/04/2005  DATE OF DISCHARGE:                              CARDIAC CATHETERIZATION   REFERRING PHYSICIAN:  Talmadge Coventry, M.D.   PROCEDURES:  1.  Left heart catheterization.  2.  Coronary angiography.  3.  Left ventriculography.   OPERATOR:  Armanda Magic, M.D.   COMPLICATIONS:  None.   IV ACCESS:  Via right femoral artery with 6-French sheath.   INDICATIONS:  Chest pain, recent non-Q-wave MI.   This is a very pleasant 77 year old white female who has been having  frequent episodes of neck pain which were felt to probably be an anginal  equivalent.  She underwent stress Cardiolite study in our office yesterday  and had ST-segment elevation in the inferior leads, had in the recovery, and  then developed ST-segment depression in I aVL as well as precordial leads.  The second just showed reversible defects in the anterior septum, lateral  wall, and inferior leads with mild LV dysfunction, EF 46%. She now presents  for heart catheterization.   The patient is brought to cardiac catheterization laboratory in the fasting  nonsedated state. Informed consent was obtained. The patient was attached to  continuous heart rate and pulse oximetry monitoring, intermittent blood  pressure monitoring. The right groin was prepped and draped in sterile  fashion; 1% Xylocaine was used for local anesthesia. Using modified  Seldinger technique, a 6-French sheath was placed in the right femoral  artery. Under fluoroscopic guidance, a 6-French JL-4 catheter was placed in  the left coronary artery. Multiple cine films were taken at 30 degree RAO,  40 degree LAO views. This catheter was exchanged out over a guidewire for 6-  [place]  JR-4 catheter which was placed under fluoroscopic guidance in the  right coronary artery. Multiple cine films were taken at 30 degree RAO, 40  degree LAO views. This catheter was then exchanged out over a guidewire for  6-French angled pigtail catheter which was placed under fluoroscopic  guidance in the left ventricular cavity. Left ventriculography was performed  in 30 degree RAO view using total of 30 cc of contrast at 15 cc per second.  The catheter was then exchanged out over a guidewire.  At the end of the  procedure, all catheters and sheaths were removed. Manual compression was  performed until adequate hemostasis was obtained. The patient was  transferred back to room in stable condition.   RESULTS:  1.  Left main coronary artery is widely patent with a distal 30% narrowing.      It then bifurcates into an LAD and the left circumflex. The left      anterior descending artery is diffusely diseased up to 80% in the      proximal portion giving rise to a very small and highly diseased      diagonal-1.  Just distal to the takeoff of diagonal, the LAD is occluded.      There does appear to be faint collateral filling from the obtuse      marginal system over to the distal LAD. The left circumflex has a 30%      ostial narrowing and then an 80% narrowing in the proximal portion      before giving rise to a large obtuse marginal branch which is widely      patent throughout its course. The ongoing left circumflex traverses the      A-V groove has tandem lesions of 99 and 80% in the ongoing vessel.   The right coronary artery has a 70 to 80% proximal stenosis, and then it is  diffusely diseased up to 70% in the mid portion. Distally, there is a 95%  narrowing of the RCA before bifurcating into posterior descending artery and  posterolateral artery, both of which are widely patent.   Left ventriculography shows a very small focal area of inferior akinesis,  otherwise normal LV function,  EF 60%, aortic pressure 139/65 mmHg, LV 140/9  mmHg.   ASSESSMENT:  1.  Status post recent non-Q-wave myocardial infarction.  2.  Three-vessel obstructive coronary disease.  3.  Overall normal left ventricular function.   PLAN:  1.  CVTS consult for CABG.  2.  IV heparin, nitroglycerin drip, aspirin, Lopressor 25 mg b.i.d.       TT/MEDQ  D:  02/04/2005  T:  02/04/2005  Job:  [id number]   cc:   Talmadge Coventry, M.D.  [address]  [place]  [place] [address]  Fax: [phone]

## 2011-03-13 NOTE — Op Note (Signed)
[name], DEROSA              ACCOUNT NO.:  [id number]   MEDICAL RECORD NO.:  [id number]          PATIENT TYPE:  INP   LOCATION:  2312                         FACILITY:  [facility]   PHYSICIAN:  Salvatore Decent. Dorris Fetch, M.D.DATE OF BIRTH:  [birth year]   DATE OF PROCEDURE:  02/06/2005  DATE OF DISCHARGE:                                 OPERATIVE REPORT   PREOPERATIVE DIAGNOSIS:  Severe three vessel coronary disease with unstable  angina.   POSTOPERATIVE DIAGNOSIS:  Severe three vessel coronary disease with unstable  angina.   PROCEDURE:  Median sternotomy, extracorporeal circulation, coronary artery  bypass grafting x 5 (left internal mammary artery to left anterior  descending, saphenous vein graft to first and second obtuse marginals,  sequential saphenous vein graft to the posterior descending and  posterolateral).   SURGEON:  Salvatore Decent. Dorris Fetch, M.D.   ASSISTANT:  Pecola Leisure, P.A.-C.   ANESTHESIA:  General.   FINDINGS:  Saphenous vein with unusual anatomy harvested in open fashion, it  was of good quality. Mammary small but good quality.  OM2 poor quality.  Remaining targets good quality.   CLINICAL NOTE:  [name] is a 77 year old female who presents with  unstable progressive anginal symptoms.  At catheterization she had severe  three vessel coronary disease.  She was referred for coronary artery bypass  grafting.  The patient was advised of the indications, risks, benefits, and  alternatives.  She understood and accepted the risks and agreed to proceed.   OPERATIVE NOTE:  [name] was brought to the preop holding area on February 06, 2005.  Lines were placed by the anesthesia service to monitor arterial,  central venous, and pulmonary arterial pressure.  EKG leads were in place  for continuous telemetry.  Intravenous antibiotics were administered.  Of  note, prior to going back to the operating suite, the patient had an episode  of angina with ST changes on  the monitor which responded promptly to  increase in intravenous nitroglycerin infusion.  The patient was transported  to the operating room, anesthetized, and intubated.  A Foley catheter was  placed.  The chest, abdomen, and legs were prepped and draped in the usual  fashion.   An incision was made in the medial aspect of the right leg.  The vein was  identified there and then was exposed endoscopically, approximately 6 inches  above the leg, the vein came into a confluence of veins and there were  multiple veins and no clear dominant saphenous vein could be identified  endoscopically.  Therefore, the vein was harvested in an open fashion.  The  greater saphenous vein turned out to be a good quality vessel.   Simultaneously, a median sternotomy was performed.  There was sternal  osteoporosis.  The left internal mammary artery was harvested in standard  fashion.  This was a small caliber vessel but with good flow.  The patient  was heparinized prior to dividing the distal end of the mammary artery.  The  pericardium was opened.  After assuring adequate anticoagulation with ACT  measurement, the aorta was cannulated via concentric  2-0 Ethibond pledgeted  purse-string sutures.  A dual stage venous cannula was placed through a  purse-string suture in the right atrial appendage.  Cardiopulmonary bypass  was instituted.  It should be noted that ACT measurement was adjusted for  aprotinin use.  Flows were maintained per protocol throughout the bypass  period.  The coronary arteries were inspected and anastomotic sites were  chosen.  The conduits were inspected and cut to length.  A foam pad was  placed in the pericardium to protect the left phrenic nerve.  A temperature  probe was placed in the myocardial septum and a cardioplegic cannula was  placed in the ascending aorta.  The aorta was crossclamped, the left  ventricle was emptied via the aortic root vent, cardiac arrest was achieved  with  a combination of cold antegrade cardioplegia and topical iced saline.   After achieving a complete diastolic arrest and myocardial septal cooling  using a combination of antegrade and retrograde cardioplegia, the following  distal anastomoses were performed:  First, reversed saphenous vein graft was  placed sequentially to the posterior descending and posterolateral branches  of the right coronary artery.  The posterior descending was a 1.5 mm vessel,  the posterolateral was 1.3 mm.  A side-to-side anastomosis was performed to  the posterior descending artery and end-to-side to the posterolateral.  Both  were good quality targets.  The vein was of good quality.  Both anastomoses  were probed proximally and distally at their completion.  There was good  flow through the graft, cardioplegia was administered, and there was good  hemostasis.   Next, a reversed saphenous vein graft was placed sequentially to obtuse  marginal one and two.  Obtuse marginal one was the larger dominant branch,  it was a 1.5 mm diameter vessel.  OM2 was a small 1 mm vessel, it was poor  quality.  A side-to-side anastomosis was performed to obtuse marginal one  and end-to-side to OM2, both were performed with running 7-0 Prolene  sutures.  Again, both were probed proximally and distally to insure patency.  The vein graft, again, was of small caliber but good quality.   Next, the left internal mammary artery was brought through a window in the  pericardium, the distal end was spatulated, it was then anastomosed end-to-  side to the distal LAD.  The LAD was a 1.5 mm vessel, it was good quality at  the site of the anastomosis, it was heavily calcified proximal to the  anastomosis and was totally occluded proximal to that.  Again, the vessel  was good quality at the site of the anastomosis.  The anastomosis was performed end-to-side with a running 8-0 Prolene suture.  At the completion  of the anastomosis, the bulldog  clamp was briefly removed, immediate and  rapid septal rewarming was noted, the bulldog clamp was replaced.  The  mammary pedicle was tacked to the epicardial surface of the heart with 6-0  Prolene sutures.   Additional cardioplegia was administered.  The proximal vein graft  anastomoses were performed to 4.5 mm punch aortotomies with running 6-0  Prolene sutures.  At the completion of the final proximal anastomosis, the  patient was placed in steep Trendelenburg position.  The aortic root was  deaired.  The balloon was deflated on the retrograde cardioplegia cannula.  The bulldog clamp was again removed from the left mammary artery.  Lidocaine  was administered.  The aortic Cross clamp was removed, total crossclamp time  was  75 minutes.   The patient was in heart block.  Rewarming had been initiated during the  mammary anastomosis.  All proximal and distal anastomoses were inspected for  hemostasis.  Epicardial pacing wires were placed on the right ventricle and  right atrium.  A low dose Dopamine infusion was initiated.  DDD pacing was  initiated.  When the patient had warmed to a core temperature of 37 degrees  Celsius, she was weaned from cardiopulmonary bypass without difficulty.  Total bypass time was 108 minutes.  The initial cardiac index was greater  than 1.8 liters per minute per meter squared and the patient remained  hemodynamically stable throughout post bypass period.  A test dose of  protamine was administered and was well tolerated.  The atrial and aortic  cannulae were removed, the remainder of the protamine was administered  without incident.  The chest was irrigated with 1 liter of warm normal  saline containing 1 gram vancomycin.  Hemostasis was achieved.  Left pleural  and two mediastinal chest tubes were placed through separate subcostal  incisions.  The pericardium was not reapproximated.  The sternum was closed  with interrupted heavy gauge stainless steel wires.   The patient tolerated  the sternal closure well without  hemodynamic compromise.  The remainder of the incision was closed in  standard fashion.  All sponge, instrument, and needle counts were correct at  the end of the procedure.  The patient remained hemodynamically stable and  was taken from the operating room to the surgical intensive care unit in  critical but stable condition.      SCH/MEDQ  D:  02/06/2005  T:  02/06/2005  Job:  [id number]

## 2011-03-13 NOTE — Discharge Summary (Signed)
[name], [name]              ACCOUNT NO.:  [id number]   MEDICAL RECORD NO.:  [id number]          PATIENT TYPE:  INP   LOCATION:  2004                         FACILITY:  [facility]   PHYSICIAN:  Salvatore Decent. Dorris Fetch, M.D.DATE OF BIRTH:  07-19-1924   DATE OF ADMISSION:  02/03/2005  DATE OF DISCHARGE:                                 DISCHARGE SUMMARY   ADMISSION DIAGNOSIS:  Chest pain.   PAST MEDICAL HISTORY AND DISCHARGE DIAGNOSES:  1.  Hypothyroidism.  2.  Hyperlipidemia.  3.  Hypertension.  4.  Status post hysterectomy.  5.  Status post bladder tacking.  6.  Status post arthroscopy of the knee.  7.  Three-vessel coronary artery disease status post coronary artery bypass      grafting x 5.   ALLERGIES:  ERYTHROMYCIN causes stomach upset.   BRIEF HISTORY:  The patient is a 77 year old Caucasian female who, for the  past several weeks prior to admission, had been experiencing neck pain.  She  experienced this once or twice a day, usually with exertion, but had some  episodes not related to exertion.  The patient was seen, evaluated, and  scheduled or a stress echocardiogram which was performed on February 03, 2005,  and was positive.  She was scheduled for elective cardiac catheterization as  an outpatient; however, the patient presented to the emergency room on February 03, 2005, with chest pain and neck pain.  The patient was evaluated and  found to have elevated troponin.  She was admitted, started on IV heparin  and nitrates as well as aspirin and beta blockers.  She remained pain free  since that time.   HOSPITAL COURSE:  The patient was admitted via the emergency room on February 03, 2005, with complaints of chest and neck pain. She was found to have had  a myocardial infarction and was admitted for IV heparin, nitrates, aspirin,  and beta blockers as previously stated.  The patient then underwent cardiac  catheterization on February 03, 2005, which revealed three-vessel  coronary  artery disease with a preserved left ventricular function and ejection  fraction of 60%.  Secondary to this information, Dr. Dorris Fetch of CVTS  services was consulted regarding surgical revascularizaiton.  Dr.  Dorris Fetch evaluated the patient on February 04, 2005, and it was his opinion  that patient should proceed with coronary artery bypass grafting.   The patient was maintained on routine hospital care until her surgery could  be scheduled.  She remained in stable condition and pain free until that  time.   The patient was taken to the OR on February 06, 2005, for coronary artery  bypass grafting x 5.  Left internal mammary artery was grafted to the LAD,  saphenous vein was grafted in sequence to the first and second obtuse  marginal, saphenous vein was grafted in sequence to the posterior descending  and posterolateral arteries.  Saphenous vein was harvested in open fashion  from the right thigh.  The patient tolerated the procedure well and was  hemodynamically stable immediately postoperatively.  The patient was  transferred from the OR  to the SICU in stable condition.  The patient was  extubated without complication and woke up from anesthesia neurologically  intact.   On postoperative day #1, the patient was afebrile with stable vital signs  and without complaint.  She was maintaining sinus bradycardia and was AAI  paced at 90.  The patient was volume overloaded postoperatively and was  diuresed accordingly.  Chest tubes and invasive lines were discontinued in  routine manner, and invasive drips were also discontinued without  complication.  The pacer was eventually disconnected, and the patient was  maintained in normal sinus rhythm throughout the remainder of the hospital  course.   The remainder of the patient's postoperative course has progressed as  expected.  She began cardiac rehab on postoperative day #1 and has tolerated  this well.  On postoperative day #4,  the patient is afebrile with stable  vital signs and is maintaining normal sinus rhythm.  She is without  complaint.  On physical exam, cardiac is regular rate and rhythm.  The  abdomen is soft and nontender.  The wounds are clean and dry, and the lungs  reveal mild basilar crackles.  There is some mild peripheral edema present.  The patient is in stable condition at this time, and as long as she  continues to progress in current manner, will be ready for discharge in the  next one to two days pending morning round evaluation.  The patient is  already on a beta blocker and will be started on Altace prior to discharge.   LABORATORY DATA:  CBC and BMP on February 10, 2005, showed white count 10.8,  hematocrit 29, platelets 288.  Sodium 140, potassium 3.6, BUN 9, creatinine  0.9, glucose 109.  h   CONDITION ON DISCHARGE:  Improved.   INSTRUCTIONS:  #1   MEDICATIONS:  1.  Aspirin 325 mg daily.  2.  Toprol XL 25 mg daily.  3.  Altace 2.5 mg daily.  4.  Lipitor 10 mg q.h.s.  5.  Levoxyl 25 mcg daily.  6.  Ultram 50 mg 1 to 2 q.4-6h. p.r.n. pain.   ACTIVITY:  No driving, no lifting of more than 10 pounds, and the patient  should continue daily breathing and walking exercises.   DIET:  Low-salt, low-fat.   WOUND CARE:  The patient may shower daily and clean incisions with soap and  water.  If wound problems arise, the patient should contact the CVTS office.   FOLLOW UP:  1.  Followup appointment with Dr. Mayford Knife.  The patient has been instructed      to contact her office for appointment      two weeks after discharge.  A chest x-ray will be taken at the time of      the appointment with Dr. Mayford Knife and will be given to the patient to      bring with her to the followup appointment with [facility].  2.  Dr. Dorris Fetch on Mar 05, 2005, at 12:15.      AY/MEDQ  D:  02/10/2005  T:  02/10/2005  Job:  [id number]   cc:   Dr.  Mayford Knife

## 2011-03-13 NOTE — Discharge Summary (Signed)
[name], [name]                 ACCOUNT NO.:  [id number]   MEDICAL RECORD NO.:  [id number]          PATIENT TYPE:  INP   LOCATION:  2010                         FACILITY:  [facility]   PHYSICIAN:  Armanda Magic, M.D.     DATE OF BIRTH:  1924/10/04   DATE OF ADMISSION:  02/19/2007  DATE OF DISCHARGE:  02/23/2007                               DISCHARGE SUMMARY   ADMISSION DIAGNOSIS:  Atrial fibrillation with rapid ventricular  response.   DISCHARGE DIAGNOSES:  1. Atrial fibrillation with rapid ventricular response, status post      spontaneous conversion to sinus rhythm.  2. History of coronary artery disease, status post CABG in April 2006      with a mammary graft to the LAD, vein graft sequentially to the      first and second marginal, vein graft sequentially to the posterior      ascending and posterior lateral branches.  3. Systemic anticoagulation with Coumadin therapy.  4. Hypothyroidism, currently not being treated.  5. Hypertension.  6. Dyslipidemia.  7. History of mild hematuria.  8. Recent admission and discharge for bilateral neck pain as well as      right shoulder pain with negative cardiac involvement.  9. Status post hysterectomy.  10.Status post bladder tack.  11.Status post knee arthroscopy.  12.Leukocytosis questionable etiology.  13.Anemia, stable.   CONSULTATIONS:  None.   PROCEDURES:  None.   HOSPITAL COURSE:  The patient is an 76 year old Caucasian female with a  known history of coronary artery disease, status post CABG in 2006,  April.  She was recently discharged from the [facility] for  bilateral neck pain as well as right shoulder pain that revealed no  cardiac involvement so she was discharged to home in stable condition.  She was readmitted to the [facility] on February 19, 2007 with  atrial fibrillation with RVR.  She was started on IV Cardizem and  continued on Coumadin therapy.  A 2-D echocardiogram revealed normal LV  systolic  function with an EF of 60%.  There was no regional wall motion  abnormalities.  TSH was within normal limits at 4.848.  Point of care  enzymes were negative x2.  Serial cardiac enzymes revealed an elevated  troponin-I that peaked at 0.45, trending downward prior to discharge.  CK-MB was elevated x2.  Elevated enzymes were believed to be secondary  to atrial fibrillation with RVR.  D-dimer was within normal limits at  0.30.  The patient underwent an adenosine Cardiolite which was negative  for ischemia or infarct with an EF of 58%.  She converted to sinus  rhythm and IV Cardizem was discontinued.  She was then loaded on  amiodarone 400 mg twice a day while in the hospital.  Today 12-lead EKG  revealed sinus bradycardia with a ventricular rate of 58 beats per  minute.  Upon discharge her Toprol XL 50 mg was decreased from twice  daily to once daily.  She was continued on digoxin which was a part of  her normal home medical regimen.  She was discharged  to home in stable  condition on today without further complaints of chest pain,  palpitations, shortness of breath, dizziness, fatigue, weakness, near  syncope, or syncope.  She was seen and examined by Dr. Armanda Magic who  was in agreement with the discharge decision.  Her Altace was  discontinued during this hospital admission.  The patient was previously  on a statin for dyslipidemia, however, with the addition of amiodarone  that was stopped and Pravachol 80 mg was started.   LABORATORY DATA:  PT 28.3, INR 2.5, TSH 4.848, Dig level 0.4, point of  care enzymes; myoglobin 110 and 150, CK-MB 2.2 and 2.6.  Troponin-I less  than 0.05 and 0.05.  BNP 54.0.  Serial cardiac enzymes; CK total 85, 79,  and 71, CK-MB 6.6, 6.4, in 3.9.  Troponin-I 0.25, 0.45, and 0.43.  Sodium 139, potassium 4.6, chloride 107, CO2 26, glucose 150, BUN 10,  creatinine 0.74, total bilirubin 0.8, alkaline phosphatase 57, AST 21,  ALT 18, total protein 6.2, serum albumin  3.3, uncorrected calcium 9.4,  white blood count 11.3, hemoglobin 11.7, hematocrit 35.2, platelets  359,000.   X-RAYS:  Chest x-ray February 19, 2007 revealed cardiomegaly with  emphysema.  No acute cardiopulmonary findings.   EKG:  As stated in the hospital course.   CONDITION UPON DISCHARGE:  Stable.   DISCHARGE MEDICATIONS:  1. Warfarin as directed.  2. Digoxin 0.125 mg daily.  3. Toprol XL 50 mg daily.  4. Aspirin 81 mg daily.  5. Pravachol 80 mg daily.  This represented a new prescription. A      prescription was given with refills.  6. Amiodarone 400 mg twice daily for 7 days, then 400 mg daily for 7      days, then 200 mg daily thereafter.  A prescription was given with      refills.   DISCHARGE INSTRUCTIONS:  1. Continue a low-salt, low-fat, and low-cholesterol diet.  2. Increase activity slowly.  3. Stop Altace.   FOLLOW-UP APPOINTMENTS:  1. A follow-up appointment with the pharmacist at [facility] has      been scheduled for PT and INR check on May 2 at 11 o'clock a.m.  2. EKG on May 8 at 9:30 a.m. to check QTC.  3. Follow-up appointment with Dr. Armanda Magic on May 13 at 10:15      a.m..  The patient is scheduled to call [phone] if she is unable      to make the scheduled appointment.      Tylene Fantasia, [place]      Armanda Magic, M.D.  Electronically Signed    RDM/MEDQ  D:  02/23/2007  T:  02/23/2007  Job:  [id number]   cc:   Armanda Magic, M.D.  Sigmund Hazel, M.D.

## 2011-03-13 NOTE — Consult Note (Signed)
Rivas, [name]                 ACCOUNT NO.:  [id number]   MEDICAL RECORD NO.:  [id number]          PATIENT TYPE:  INP   LOCATION:  2928                         FACILITY:  [facility]   PHYSICIAN:  Salvatore Decent. Dorris Fetch, M.D.DATE OF BIRTH:  04/04/1924   DATE OF CONSULTATION:  02/04/2005  DATE OF DISCHARGE:                                   CONSULTATION   REASON FOR CONSULTATION:  Three-vessel coronary disease.   HISTORY OF PRESENT ILLNESS:  [name] is a 77 year old female who over the  past several weeks has been having neck pain. She describes this as a  pulling or a tightening sensation in her neck. She has been having this once  or twice daily, usually with exertion, but she has had some episodes that  were not related to exertion. She was seen and evaluated and scheduled for a  stress echocardiogram. That was done yesterday and was positive. She was  scheduled for cardiac catheterization as an outpatient; however, she came to  the emergency room last night. She was started on heparin, intravenous  nitrates, aspirin, and beta blockers. Interestingly, her troponins were  elevated. Her CK-MB was 4.5 with a troponin of 9.54. She had been pain-free  since admission.   Today, she underwent cardiac catheterization which showed severe three-  vessel disease with overall preserved left ventricular function. Ejection  fraction was 60%. She currently is pain-free.   PAST MEDICAL HISTORY:  1.  Significant for hypothyroidism.  2.  Hyperlipidemia.  3.  Mild hypertension, not requiring medication.  4.  History of hysterectomy.  5.  Bladder tacking.  6.  Previous arthroscopy on her knee.  7.  She denies previous heart disease.   MEDICATIONS:  1.  Levoxyl 25 mcg q.d.  2.  Aspirin 81 mg q.d.   ALLERGIES:  She has an allergy to ERYTHROMYCIN with stomach upset.   FAMILY HISTORY:  Father died at 19 of CVA. Her mother died at 104 of a  myocardial infarction. She does have a sister with  coronary disease as well.   SOCIAL HISTORY:  She has three children. She lives by herself. She does not  use tobacco or alcohol.   REVIEW OF SYMPTOMS:  She noted recently an irregular heart rhythm and a  heart rate that resolved spontaneously. She has had some constipation. No  other recent changes in bowel or bladder habits. All other systems are  negative.   PHYSICAL EXAMINATION:  GENERAL:  [name] is a 77 year old white female in  no acute distress. She is alert and oriented x 3. She is well-developed,  well-nourished.  VITAL SIGNS:  She weighs 142 pounds. Her blood pressure is 140/65, pulse is  72 and regular, respirations are 16.  NEUROLOGICAL:  She has a nonfocal exam.  HEENT:  Unremarkable.  NECK:  Supple. There is no thyromegaly, adenopathy, or bruits.  CARDIOVASCULAR:  Regular rate and rhythm. Normal S1 and S2. There are no  murmurs, gallops, or rubs.  LUNGS:  Clear with equal breath sounds bilaterally.  ABDOMEN:  Soft and nontender.  EXTREMITIES:  Without clubbing,  cyanosis, or edema. She has 2+ pulses.  SKIN:  Warm and dry.   LABORATORY DATA:  EKG showed lateral __________ . Her cardiac enzymes were  as mentioned. Hematocrit was 38, white count 9.4, platelets 370,000.  Remainder of her labs are pending.   IMPRESSION:  Mr. Cienfuegos is a 77 year old female with no prior cardiac  history. She now presents with accelerating angina and has had an out of  hospital myocardial infarction by troponin. At catheterization, she has  severe three-vessel disease but preserved left ventricular function.  Coronary artery bypass grafting is indicated for survival benefits as well  as __________ , no GI complications. She understands and accepts these risks  and agrees to proceed with surgery. She will be scheduled for the first  available operative slot which is Friday morning.      SCH/MEDQ  D:  02/04/2005  T:  02/05/2005  Job:  [id number]   cc:   Armanda Magic, M.D.  [address], [address]  [place], [place] [address]  Fax: [phone]   Talmadge Coventry, M.D.  [address]  [place]  [place] [address]  Fax: [phone]

## 2011-06-01 ENCOUNTER — Encounter: Payer: Self-pay | Admitting: Internal Medicine

## 2011-06-26 ENCOUNTER — Encounter: Payer: Self-pay | Admitting: Internal Medicine

## 2011-06-26 ENCOUNTER — Ambulatory Visit (INDEPENDENT_AMBULATORY_CARE_PROVIDER_SITE_OTHER): Payer: Medicare Other | Admitting: Internal Medicine

## 2011-06-26 DIAGNOSIS — R1310 Dysphagia, unspecified: Secondary | ICD-10-CM

## 2011-06-26 NOTE — Progress Notes (Signed)
Subjective:    Patient ID: [name], female    DOB: [birth year], 77 y.o.   MRN: [id number]  HPI [name] is an 77 year old female with a past medical history of CAD status post CABG, atrial fibrillation, hypertension, hyperlipidemia, mild COPD who presents today with her 2 daughters for evaluation of dysphagia.  The patient reports today that she is doing very well and is without specific complaints. Her daughters however, state that they're concerned because she has had trouble swallowing for the past few months (? 6-8 months).  They note this to be worse with solid foods and dust she has primarily been eating liquids only. Her diet consists of Ensure, soda, and water. Her daughters note it has been some time since she has eaten a full meal. They state that she has an appetite but has difficulty eating. They note no troubles with liquids.  The patient denies heartburn at her daughter's report no complaints of heartburn. Also no known regurgitation. They do note she very inconsistently takes her medications because she "doesn't like to". No report of abdominal pain. Her daughter states she does report nausea but they've seen no vomiting. Reportedly her weight has been stable. No recent fevers or chills.   Review of Systems Constitutional: Negative for fever, chills, night sweats, activity change, appetite change and unexpected weight change HEENT: Negative for sore throat, mouth sores. Eyes: Negative for visual disturbance Respiratory: Negative for cough, chest tightness and shortness of breath Cardiovascular: Negative for chest pain, palpitations and lower extremity swelling Gastrointestinal: See history of present illness Genitourinary: Negative for dysuria and hematuria. Musculoskeletal: Negative for back pain, arthralgias and myalgias Skin: Negative for rash or color change Neurological: Negative for headaches, weakness, numbness Hematological: Negative for adenopathy, negative for easy  bruising/bleeding Psychiatric/behavioral: Negative for depressed mood, negative for anxiety  Past Medical History  Diagnosis Date  . Cardiac dysrhythmia, unspecified   . Unspecified essential hypertension   . Other and unspecified hyperlipidemia   . Atrial fibrillation   . CAD (coronary artery disease), s/p CABG   Mild COPD Dysphagia Anxiety/depression  Current outpatient prescriptions:aspirin 81 MG tablet, Take 81 mg by mouth daily.  , Disp: , Rfl: ;  Calcium-Magnesium-Zinc 1000-400-15 MG TABS, Take by mouth. Daily , Disp: , Rfl: ;  ferrous sulfate 325 (65 FE) MG tablet, Take 325 mg by mouth daily with breakfast.  , Disp: , Rfl: ;  furosemide (LASIX) 20 MG tablet, Take 20 mg by mouth daily.  , Disp: , Rfl:  metoprolol succinate (TOPROL-XL) 25 MG 24 hr tablet, Take 50 mg by mouth daily. , Disp: , Rfl: ;  ramipril (ALTACE) 2.5 MG capsule, Take 5 mg by mouth daily. , Disp: , Rfl: ;  rosuvastatin (CRESTOR) 20 MG tablet, Take 20 mg by mouth daily.  , Disp: , Rfl: ;  sertraline (ZOLOFT) 50 MG tablet, Take 50 mg by mouth daily. Takes 75 mg, Disp: , Rfl:   No Known Allergies   Social History  . Marital Status: Widowed   Occupational History  . Retired, lives alone.  Daughter's help with ADLs including cooking, cleaning.    Social History Main Topics  . Smoking status: Never Smoker   . Smokeless tobacco: Never Used  . Alcohol Use: No  . Drug Use: No   Family History  Problem Relation Age of Onset  . Heart disease Father   . Heart disease Father   . Heart disease Sister     brother  . Cancer Sister  ovarian, throat  . Leukemia Brother       Objective:   Physical Exam BP 140/82  Pulse 66  Ht 5\' 4"  (1.626 m)  Wt 159 lb (72.122 kg)  BMI 27.29 kg/m2 Constitutional: Well-developed and well-nourished. No distress. HEENT: Normocephalic and atraumatic. Oropharynx is clear and moist. No oropharyngeal exudate. Conjunctivae are normal. Pupils are equal round and reactive to light.  No scleral icterus. Neck: Neck supple. Trachea midline. Cardiovascular: Normal rate, regular rhythm and intact distal pulses. No M/R/G Pulmonary/chest: Effort normal and breath sounds normal. No wheezing, rales or rhonchi. Abdominal: Soft, nontender, nondistended. Bowel sounds active throughout. There are no masses palpable. No hepatosplenomegaly. Lymphadenopathy: No cervical adenopathy noted. Neurological: Alert, nonfocal Skin: Skin is warm and dry. No rashes noted. Psychiatric: Normal mood and affect. Behavior is normal.  Colonoscopy - daughters report done approximately 2008 - reportedly normal.  They report no history of prior colorectal polyps    Assessment & Plan:  This is an 77 year old female with a past medical history of CAD status post CABG, atrial fibrillation, hypertension, hyperlipidemia, mild COPD who presents today with her 2 daughters for evaluation of dysphagia.  1. Dysphagia - symptoms have been going on for approximately 6 months and are primarily solid food related.  Differential includes esophageal stricture, esophageal dysmotility, and mass lesion.  Greater than 15 minutes was spent discussing the diagnostic workup for her dysphasia. We discussed EGD and barium swallow. After discussion with the daughters we will proceed with EGD. We extensively discussed the risk and benefit of this test including bleeding, infection, aspiration, perforation, and oversedation.  We also discuss esophageal dilatation, and that this can be done at endoscopy if necessary. Family and patient made aware of higher risk of esophageal perforation if esophageal dilatation is performed.

## 2011-06-26 NOTE — Patient Instructions (Signed)
You have been scheduled for an Endoscopy. Instructions have been provided.  

## 2011-07-22 ENCOUNTER — Other Ambulatory Visit: Payer: Medicare Other | Admitting: Internal Medicine

## 2011-11-06 ENCOUNTER — Other Ambulatory Visit: Payer: Self-pay | Admitting: Geriatric Medicine

## 2011-11-06 DIAGNOSIS — R269 Unspecified abnormalities of gait and mobility: Secondary | ICD-10-CM

## 2011-11-10 ENCOUNTER — Ambulatory Visit
Admission: RE | Admit: 2011-11-10 | Discharge: 2011-11-10 | Disposition: A | Discharge status: Home / Self Care | Payer: Medicare Other | Source: Ambulatory Visit | Attending: Geriatric Medicine | Admitting: Geriatric Medicine

## 2011-11-10 DIAGNOSIS — R269 Unspecified abnormalities of gait and mobility: Secondary | ICD-10-CM

## 2013-08-02 ENCOUNTER — Ambulatory Visit: Payer: Medicare Other | Admitting: Interventional Cardiology

## 2013-10-23 ENCOUNTER — Emergency Department (HOSPITAL_COMMUNITY): Payer: Medicare Other

## 2013-10-23 ENCOUNTER — Encounter (HOSPITAL_COMMUNITY): Payer: Self-pay | Admitting: *Deleted

## 2013-10-23 ENCOUNTER — Emergency Department (HOSPITAL_COMMUNITY)
Admission: EM | Admit: 2013-10-23 | Discharge: 2013-10-23 | Disposition: A | Discharge status: Home / Self Care | Payer: Medicare Other | Attending: Emergency Medicine | Admitting: Emergency Medicine

## 2013-10-23 DIAGNOSIS — Z862 Personal history of diseases of the blood and blood-forming organs and certain disorders involving the immune mechanism: Secondary | ICD-10-CM | POA: Insufficient documentation

## 2013-10-23 DIAGNOSIS — Z8639 Personal history of other endocrine, nutritional and metabolic disease: Secondary | ICD-10-CM | POA: Insufficient documentation

## 2013-10-23 DIAGNOSIS — I2510 Atherosclerotic heart disease of native coronary artery without angina pectoris: Secondary | ICD-10-CM | POA: Insufficient documentation

## 2013-10-23 DIAGNOSIS — Z79899 Other long term (current) drug therapy: Secondary | ICD-10-CM | POA: Insufficient documentation

## 2013-10-23 DIAGNOSIS — Z951 Presence of aortocoronary bypass graft: Secondary | ICD-10-CM | POA: Insufficient documentation

## 2013-10-23 DIAGNOSIS — K2970 Gastritis, unspecified, without bleeding: Secondary | ICD-10-CM | POA: Insufficient documentation

## 2013-10-23 DIAGNOSIS — I10 Essential (primary) hypertension: Secondary | ICD-10-CM | POA: Insufficient documentation

## 2013-10-23 DIAGNOSIS — R42 Dizziness and giddiness: Secondary | ICD-10-CM | POA: Insufficient documentation

## 2013-10-23 LAB — COMPREHENSIVE METABOLIC PANEL
Albumin: 3.7 g/dL (ref 3.5–5.2)
BUN: 8 mg/dL (ref 6–23)
Calcium: 9.5 mg/dL (ref 8.4–10.5)
Creatinine, Ser: 0.71 mg/dL (ref 0.50–1.10)
GFR calc Af Amer: 87 mL/min — ABNORMAL LOW (ref >90)
Glucose, Bld: 118 mg/dL — ABNORMAL HIGH (ref 70–99)
Potassium: 3.6 mEq/L (ref 3.5–5.1)
Total Bilirubin: 0.4 mg/dL (ref 0.3–1.2)
Total Protein: 6.5 g/dL (ref 6.0–8.3)

## 2013-10-23 LAB — CBC WITH DIFFERENTIAL/PLATELET
Basophils Relative: 0 % (ref 0–1)
Eosinophils Absolute: 0.1 10*3/uL (ref 0.0–0.7)
Eosinophils Relative: 1 % (ref 0–5)
HCT: 40.7 % (ref 36.0–46.0)
Hemoglobin: 13.1 g/dL (ref 12.0–15.0)
Lymphs Abs: 1.8 10*3/uL (ref 0.7–4.0)
MCH: 26.6 pg (ref 26.0–34.0)
MCHC: 32.2 g/dL (ref 30.0–36.0)
MCV: 82.7 fL (ref 78.0–100.0)
Monocytes Absolute: 0.7 10*3/uL (ref 0.1–1.0)
Monocytes Relative: 5 % (ref 3–12)
Neutro Abs: 11.2 10*3/uL — ABNORMAL HIGH (ref 1.7–7.7)
Neutrophils Relative %: 81 % — ABNORMAL HIGH (ref 43–77)
WBC: 13.8 10*3/uL — ABNORMAL HIGH (ref 4.0–10.5)

## 2013-10-23 LAB — LIPASE, BLOOD: Lipase: 27 U/L (ref 11–59)

## 2013-10-23 MED ORDER — SODIUM CHLORIDE 0.9 % IV BOLUS (SEPSIS)
250.0000 mL | Freq: Once | INTRAVENOUS | Status: AC
  Administered 2013-10-23: 250 mL via INTRAVENOUS

## 2013-10-23 MED ORDER — IOHEXOL 300 MG/ML  SOLN
80.0000 mL | Freq: Once | INTRAMUSCULAR | Status: AC | PRN
  Administered 2013-10-23: 80 mL via INTRAVENOUS

## 2013-10-23 MED ORDER — SODIUM CHLORIDE 0.9 % IV SOLN
INTRAVENOUS | Status: DC
  Administered 2013-10-23: 10:00:00 via INTRAVENOUS

## 2013-10-23 MED ORDER — ONDANSETRON HCL 4 MG/2ML IJ SOLN
4.0000 mg | Freq: Once | INTRAMUSCULAR | Status: AC
  Administered 2013-10-23: 4 mg via INTRAVENOUS
  Filled 2013-10-23: qty 2

## 2013-10-23 MED ORDER — MECLIZINE HCL 25 MG PO TABS: 25.0000 mg | ORAL_TABLET | Freq: Four times a day (QID) | ORAL | Status: DC

## 2013-10-23 MED ORDER — IOHEXOL 300 MG/ML  SOLN
25.0000 mL | INTRAMUSCULAR | Status: AC
  Administered 2013-10-23: 25 mL via ORAL

## 2013-10-23 MED ORDER — ONDANSETRON 4 MG PO TBDP: 4.0000 mg | ORAL_TABLET | Freq: Three times a day (TID) | ORAL | Status: DC | PRN

## 2013-10-23 MED ORDER — LORAZEPAM 2 MG/ML IJ SOLN
0.5000 mg | Freq: Once | INTRAMUSCULAR | Status: AC
  Administered 2013-10-23: 0.5 mg via INTRAVENOUS
  Filled 2013-10-23: qty 1

## 2013-10-23 NOTE — ED Provider Notes (Signed)
CSN: [id number]     Arrival date & time 10/23/13  4098 History   First MD Initiated Contact with Patient 10/23/13 [phone]     Chief Complaint  Patient presents with  . Nausea   (Consider location/radiation/quality/duration/timing/severity/associated sxs/prior Treatment) The history is provided by the patient and a relative.   79 year old female stays at assisted living was doing well until around 6:00 this morning when she had sudden onset of nausea and vomiting and complained of some dizziness. Patient stays at [facility] No diarrhea patient is a DO NOT RESUSCITATE. Patient denies any abdominal pain. Patient brought in by EMS and daughter accompany. Patient has vomited multiple times. No blood in the vomit.  Past Medical History  Diagnosis Date  . Cardiac dysrhythmia, unspecified   . Unspecified essential hypertension   . Other and unspecified hyperlipidemia   . Atrial fibrillation   . CAD (coronary artery disease)    Past Surgical History  Procedure Laterality Date  . Coronary artery bypass graft    . Abdominal hysterectomy    . Orthopedic surgery     Family History  Problem Relation Age of Onset  . Heart disease Father   . Heart disease Father   . Heart disease Sister     brother  . Cancer Sister     ovarian, throat  . Leukemia Brother    History  Substance Use Topics  . Smoking status: Never Smoker   . Smokeless tobacco: Never Used  . Alcohol Use: No   OB History   Grav Para Term Preterm Abortions TAB SAB Ect Mult Living                 Review of Systems  Constitutional: Negative for fever.  HENT: Negative for congestion.   Eyes: Negative for redness.  Respiratory: Negative for shortness of breath.   Cardiovascular: Negative for chest pain.  Gastrointestinal: Positive for nausea and vomiting. Negative for abdominal pain and diarrhea.  Genitourinary: Negative for dysuria.  Musculoskeletal: Negative for back pain.  Skin: Negative for rash.  Neurological:  Negative for headaches.  Hematological: Does not bruise/bleed easily.  Psychiatric/Behavioral: Negative for confusion.    Allergies  Review of patient's allergies indicates no known allergies.  Home Medications   Current Outpatient Rx  Name  Route  Sig  Dispense  Refill  . triamcinolone cream (KENALOG) 0.1 %   Topical   Apply 1 application topically daily as needed (to fave for eczema).         . meclizine (ANTIVERT) 25 MG tablet   Oral   Take 1 tablet (25 mg total) by mouth 4 (four) times daily.   28 tablet   0   . ondansetron (ZOFRAN ODT) 4 MG disintegrating tablet   Oral   Take 1 tablet (4 mg total) by mouth every 8 (eight) hours as needed for nausea or vomiting.   12 tablet   1    BP 157/72  Pulse 75  Temp(Src) 98 F (36.7 C) (Oral)  Resp 18  Ht 5\' 3"  (1.6 m)  Wt 149 lb (67.586 kg)  BMI 26.40 kg/m2  SpO2 100% Physical Exam  Nursing note and vitals reviewed. Constitutional: She is oriented to person, place, and time. She appears well-developed and well-nourished. No distress.  HENT:  Head: Normocephalic and atraumatic.  Mouth/Throat: Oropharynx is clear and moist.  Eyes: Conjunctivae and EOM are normal. Pupils are equal, round, and reactive to light.  Neck: Normal range of motion.  Cardiovascular: Normal  rate, regular rhythm and normal heart sounds.   No murmur heard. Pulmonary/Chest: Effort normal and breath sounds normal. No respiratory distress.  Abdominal: Soft. Bowel sounds are normal. She exhibits no distension and no mass. There is no tenderness. There is no rebound and no guarding.  Musculoskeletal: Normal range of motion. She exhibits no edema and no tenderness.  Neurological: She is alert and oriented to person, place, and time. No cranial nerve deficit. She exhibits normal muscle tone. Coordination normal.  Skin: Skin is warm. No rash noted.    ED Course  Procedures (including critical care time) Labs Review Labs Reviewed  CBC WITH  DIFFERENTIAL - Abnormal; Notable for the following:    WBC 13.8 (*)    Neutrophils Relative % 81 (*)    Neutro Abs 11.2 (*)    All other components within normal limits  COMPREHENSIVE METABOLIC PANEL - Abnormal; Notable for the following:    Glucose, Bld 118 (*)    GFR calc non Af Amer 75 (*)    GFR calc Af Amer 87 (*)    All other components within normal limits  LIPASE, BLOOD  URINALYSIS, ROUTINE W REFLEX MICROSCOPIC   Results for orders placed during the hospital encounter of 10/23/13  CBC WITH DIFFERENTIAL      Result Value Range   WBC 13.8 (*) 4.0 - 10.5 K/uL   RBC 4.92  3.87 - 5.11 MIL/uL   Hemoglobin 13.1  12.0 - 15.0 g/dL   HCT 16.1  09.6 - 04.5 %   MCV 82.7  78.0 - 100.0 fL   MCH 26.6  26.0 - 34.0 pg   MCHC 32.2  30.0 - 36.0 g/dL   RDW 40.9  81.1 - 91.4 %   Platelets 337  150 - 400 K/uL   Neutrophils Relative % 81 (*) 43 - 77 %   Neutro Abs 11.2 (*) 1.7 - 7.7 K/uL   Lymphocytes Relative 13  12 - 46 %   Lymphs Abs 1.8  0.7 - 4.0 K/uL   Monocytes Relative 5  3 - 12 %   Monocytes Absolute 0.7  0.1 - 1.0 K/uL   Eosinophils Relative 1  0 - 5 %   Eosinophils Absolute 0.1  0.0 - 0.7 K/uL   Basophils Relative 0  0 - 1 %   Basophils Absolute 0.0  0.0 - 0.1 K/uL  COMPREHENSIVE METABOLIC PANEL      Result Value Range   Sodium 141  135 - 145 mEq/L   Potassium 3.6  3.5 - 5.1 mEq/L   Chloride 103  96 - 112 mEq/L   CO2 29  19 - 32 mEq/L   Glucose, Bld 118 (*) 70 - 99 mg/dL   BUN 8  6 - 23 mg/dL   Creatinine, Ser 7.82  0.50 - 1.10 mg/dL   Calcium 9.5  8.4 - 95.6 mg/dL   Total Protein 6.5  6.0 - 8.3 g/dL   Albumin 3.7  3.5 - 5.2 g/dL   AST 14  0 - 37 U/L   ALT 9  0 - 35 U/L   Alkaline Phosphatase 85  39 - 117 U/L   Total Bilirubin 0.4  0.3 - 1.2 mg/dL   GFR calc non Af Amer 75 (*) >90 mL/min   GFR calc Af Amer 87 (*) >90 mL/min  LIPASE, BLOOD      Result Value Range   Lipase 27  11 - 59 U/L    Imaging Review Ct Abdomen Pelvis W  Contrast  10/23/2013   CLINICAL  DATA:  Nausea and vomiting.  EXAM: CT ABDOMEN AND PELVIS WITH CONTRAST  TECHNIQUE: Multidetector CT imaging of the abdomen and pelvis was performed using the standard protocol following bolus administration of intravenous contrast.  CONTRAST:  80mL OMNIPAQUE IOHEXOL 300 MG/ML  SOLN  COMPARISON:  Prior radiograph performed earlier on the same day  FINDINGS: Bibasilar atelectasis and/ scarring is present. No focal infiltrate seen within the visualized lung bases. Cardiomegaly with sequelae of prior bypass is noted. No pericardial effusion. Diffuse 3 vessel coronary artery calcifications are partially visualized. Median sternotomy noted.  Liver demonstrates a normal contrast enhanced appearance. No focal intrahepatic lesions. Calcified gallstones noted within the gallbladder without evidence of acute cholecystitis. No biliary ductal dilatation the spleen is within normal limits. Adrenal glands and pancreas demonstrate a normal contrast enhanced appearance.  Kidneys are symmetric in size with symmetric enhancement. No nephrolithiasis, hydronephrosis, or focal enhancing renal mass identified.  Small hiatal hernia is noted. There is no evidence of bowel obstruction. Stomach is within normal limits. No abnormal wall thickening or inflammatory fat stranding seen about the small or large bowel. Scattered colonic diverticula are noted without acute diverticulitis. The appendix is not definitely visualize, however, no inflammatory changes are seen within the right lower quadrant or about the cecum to suggest acute appendicitis. Small fat containing periumbilical hernia is noted.  Mild diffuse this the mesenteric stranding noted within the mid abdomen. There is an enlarged 1.8 cm right periaortic lymph node (series 2, image 37). Additional shotty periaortic adenopathy measuring up to 1.1 cm is noted. This finding is of uncertain etiology, and may be reactive.  Bladder is within normal limits. Uterus and ovaries are not  visualized.  No free air or fluid identified.  Heavy aorto bi-iliac atherosclerotic calcifications noted.  Multilevel degenerative changes noted within the visualized thoracolumbar spine. No focal lytic or blastic osseous lesions identified.  IMPRESSION: 1. Periaortic adenopathy measuring up to 1.9 cm in short axis with misty mesenteric fat stranding. Finding is of uncertain etiology, but may reflect acute mesenteric adenitis. Clinical correlation is recommended. A short interval followup scan could be considered to ensure resolution as clinically indicated. 2. Colonic diverticulosis. No CT evidence of acute diverticulitis or colitis identified. 3. Cholelithiasis without CT evidence of acute cholecystitis. 4. Small hiatal hernia.   Electronically Signed   By: Rise Mu M.D.   On: 10/23/2013 14:11   Dg Abd Acute W/chest  10/23/2013   CLINICAL DATA:  Nausea, abdominal pain  EXAM: ACUTE ABDOMEN SERIES (ABDOMEN 2 VIEW & CHEST 1 VIEW)  COMPARISON:  12/13/2008  FINDINGS: Low lung volumes. Prominence of interstitial markings. Nodular areas of increased density projects within the right perihilar region and medial aspect of the right lung apex. The cardiac silhouette is enlarged. Areas of discoid atelectasis project within the right left lung bases. Atherosclerotic calcifications are appreciated within the aorta. Patient is status post median sternotomy and coronary artery bypass grafting.  Air is seen within nondilated loops of large small bowel. A moderate to large amount of stool projects within the colon. Rounded calcified densities project within the right quadrant may represent calcified gallstones.  IMPRESSION: Low lung volumes. The described findings in the lungs may be secondary to the shallow lung volumes. Alternatively focal nodular infiltrates versus nodules or masses in the right hilar and right apical regions cannot be excluded. Further evaluation dedicated PA and lateral views of chest is  recommended. Nonobstructive bowel gas pattern with a  moderate amount of stool.   Electronically Signed   By: Salome Holmes M.D.   On: 10/23/2013 10:30    EKG Interpretation   None      Date: 10/23/2013  Rate: 61  Rhythm: normal sinus rhythm and premature atrial contractions (PAC)  QRS Axis: normal  Intervals: normal  ST/T Wave abnormalities: nonspecific ST/T changes  Conduction Disutrbances:none  Narrative Interpretation:   Old EKG Reviewed: unchanged No sniffing change in EKG compared Dec 13 2008    MDM   1. Gastritis    Suspect viral gastritis patient's abdomen is benign CT scan did have some findings of some mesenteric adenopathy could be related to a viral mesenteric adenopathy or perhaps lymphoma followup CT recommended to make sure things go back to normal. In addition there is incidental finding of gallstones. He no evidence of acute cholecystitis. Doubt that this is prolonged biliary colic because patient had symptoms for 8 hours would expect some findings on CT scan of gallbladder thickening. However family precautions provided to bring her back if she's not better in 24 hours and if she is worse at all. Patient improves significantly with antinausea medicine here. No further vomiting. The only significant lab abnormality was a leukocytosis of 13.8. Again doubt that this is prolonged biliary colic. But not able to completely ruled out.  Patient will be able to stay with her daughter for the next 24 hours. They will watch her and if she gets worse they will return if she doesn't improve significantly in 24 hours they will return. Patient will be on not clear liquids and a bland diet. Patient given Antivert and Zofran to take.  Shelda Jakes, MD 10/23/13 [phone]

## 2013-10-23 NOTE — ED Notes (Signed)
Pt. Woke up with Nausea and vomiting X, pt. Called her daughter this am and told her she was not feeling well with nausea and head was spinning, Pt. Is alert and oriented To self.  Pt. Has hx of dementia.  Pt. Lives in assisted living and the doctor stopped her meds due to pt. Did not want to take them.

## 2013-10-23 NOTE — ED Notes (Signed)
Reported to DR. Zackowski pt. Is  Nauseated

## 2013-10-23 NOTE — ED Notes (Signed)
Patient is alert and orientedx4.  Patient was explained discharge instructions and they understood them with no questions.  Her daughter, Gerlene Burdock is taking the patient home.

## 2013-10-27 ENCOUNTER — Emergency Department (HOSPITAL_COMMUNITY): Payer: Medicare HMO

## 2013-10-27 ENCOUNTER — Inpatient Hospital Stay (HOSPITAL_COMMUNITY)
Admission: EM | Admit: 2013-10-27 | Discharge: 2013-10-30 | DRG: 195 | Disposition: A | Discharge status: Home Health | Payer: Medicare HMO | Attending: Internal Medicine | Admitting: Internal Medicine

## 2013-10-27 ENCOUNTER — Encounter (HOSPITAL_COMMUNITY): Payer: Self-pay | Admitting: Emergency Medicine

## 2013-10-27 DIAGNOSIS — R531 Weakness: Secondary | ICD-10-CM

## 2013-10-27 DIAGNOSIS — R1310 Dysphagia, unspecified: Secondary | ICD-10-CM

## 2013-10-27 DIAGNOSIS — J4489 Other specified chronic obstructive pulmonary disease: Secondary | ICD-10-CM | POA: Diagnosis present

## 2013-10-27 DIAGNOSIS — J189 Pneumonia, unspecified organism: Principal | ICD-10-CM

## 2013-10-27 DIAGNOSIS — R0602 Shortness of breath: Secondary | ICD-10-CM | POA: Diagnosis present

## 2013-10-27 DIAGNOSIS — R7989 Other specified abnormal findings of blood chemistry: Secondary | ICD-10-CM | POA: Diagnosis present

## 2013-10-27 DIAGNOSIS — Z66 Do not resuscitate: Secondary | ICD-10-CM | POA: Diagnosis present

## 2013-10-27 DIAGNOSIS — K8020 Calculus of gallbladder without cholecystitis without obstruction: Secondary | ICD-10-CM | POA: Insufficient documentation

## 2013-10-27 DIAGNOSIS — K047 Periapical abscess without sinus: Secondary | ICD-10-CM

## 2013-10-27 DIAGNOSIS — Z951 Presence of aortocoronary bypass graft: Secondary | ICD-10-CM

## 2013-10-27 DIAGNOSIS — R5383 Other fatigue: Secondary | ICD-10-CM

## 2013-10-27 DIAGNOSIS — R5381 Other malaise: Secondary | ICD-10-CM

## 2013-10-27 DIAGNOSIS — I2789 Other specified pulmonary heart diseases: Secondary | ICD-10-CM | POA: Diagnosis present

## 2013-10-27 DIAGNOSIS — J449 Chronic obstructive pulmonary disease, unspecified: Secondary | ICD-10-CM | POA: Diagnosis present

## 2013-10-27 DIAGNOSIS — R42 Dizziness and giddiness: Secondary | ICD-10-CM | POA: Diagnosis present

## 2013-10-27 DIAGNOSIS — F0390 Unspecified dementia without behavioral disturbance: Secondary | ICD-10-CM

## 2013-10-27 DIAGNOSIS — I272 Pulmonary hypertension, unspecified: Secondary | ICD-10-CM | POA: Diagnosis present

## 2013-10-27 DIAGNOSIS — I4891 Unspecified atrial fibrillation: Secondary | ICD-10-CM

## 2013-10-27 DIAGNOSIS — I10 Essential (primary) hypertension: Secondary | ICD-10-CM | POA: Diagnosis present

## 2013-10-27 DIAGNOSIS — I2510 Atherosclerotic heart disease of native coronary artery without angina pectoris: Secondary | ICD-10-CM | POA: Insufficient documentation

## 2013-10-27 DIAGNOSIS — D72829 Elevated white blood cell count, unspecified: Secondary | ICD-10-CM

## 2013-10-27 DIAGNOSIS — K5790 Diverticulosis of intestine, part unspecified, without perforation or abscess without bleeding: Secondary | ICD-10-CM | POA: Insufficient documentation

## 2013-10-27 DIAGNOSIS — E785 Hyperlipidemia, unspecified: Secondary | ICD-10-CM | POA: Diagnosis present

## 2013-10-27 DIAGNOSIS — Z79899 Other long term (current) drug therapy: Secondary | ICD-10-CM

## 2013-10-27 HISTORY — DX: Pulmonary hypertension, unspecified: I27.20

## 2013-10-27 HISTORY — DX: Adverse effect of unspecified anesthetic, initial encounter: T41.45XA

## 2013-10-27 HISTORY — DX: Other complications of anesthesia, initial encounter: T88.59XA

## 2013-10-27 HISTORY — DX: Unspecified dementia, unspecified severity, without behavioral disturbance, psychotic disturbance, mood disturbance, and anxiety: F03.90

## 2013-10-27 LAB — POCT I-STAT TROPONIN I: Troponin i, poc: 0.01 ng/mL (ref 0.00–0.08)

## 2013-10-27 LAB — TYPE AND SCREEN
ABO/RH(D): O POS
ANTIBODY SCREEN: POSITIVE
DAT, IgG: NEGATIVE

## 2013-10-27 LAB — URINALYSIS, ROUTINE W REFLEX MICROSCOPIC
BILIRUBIN URINE: NEGATIVE
GLUCOSE, UA: NEGATIVE mg/dL
HGB URINE DIPSTICK: NEGATIVE
KETONES UR: NEGATIVE mg/dL
Leukocytes, UA: NEGATIVE
Nitrite: NEGATIVE
PH: 8 (ref 5.0–8.0)
Protein, ur: NEGATIVE mg/dL
Specific Gravity, Urine: 1.014 (ref 1.005–1.030)
Urobilinogen, UA: 0.2 mg/dL (ref 0.0–1.0)

## 2013-10-27 LAB — CBC
HCT: 38.8 % (ref 36.0–46.0)
Hemoglobin: 12.2 g/dL (ref 12.0–15.0)
MCH: 26.1 pg (ref 26.0–34.0)
MCHC: 31.4 g/dL (ref 30.0–36.0)
MCV: 82.9 fL (ref 78.0–100.0)
PLATELETS: 356 10*3/uL (ref 150–400)
RBC: 4.68 MIL/uL (ref 3.87–5.11)
RDW: 15 % (ref 11.5–15.5)
WBC: 21.8 10*3/uL — ABNORMAL HIGH (ref 4.0–10.5)

## 2013-10-27 LAB — CBC WITH DIFFERENTIAL/PLATELET
Basophils Absolute: 0 10*3/uL (ref 0.0–0.1)
Basophils Relative: 0 % (ref 0–1)
EOS ABS: 0 10*3/uL (ref 0.0–0.7)
EOS PCT: 0 % (ref 0–5)
HCT: 42.8 % (ref 36.0–46.0)
Hemoglobin: 13.8 g/dL (ref 12.0–15.0)
Lymphocytes Relative: 5 % — ABNORMAL LOW (ref 12–46)
Lymphs Abs: 1.1 10*3/uL (ref 0.7–4.0)
MCH: 26.5 pg (ref 26.0–34.0)
MCHC: 32.2 g/dL (ref 30.0–36.0)
MCV: 82.3 fL (ref 78.0–100.0)
Monocytes Absolute: 1.3 10*3/uL — ABNORMAL HIGH (ref 0.1–1.0)
Monocytes Relative: 5 % (ref 3–12)
Neutro Abs: 21.9 10*3/uL — ABNORMAL HIGH (ref 1.7–7.7)
Neutrophils Relative %: 90 % — ABNORMAL HIGH (ref 43–77)
PLATELETS: 365 10*3/uL (ref 150–400)
RBC: 5.2 MIL/uL — ABNORMAL HIGH (ref 3.87–5.11)
RDW: 15 % (ref 11.5–15.5)
WBC: 24.3 10*3/uL — ABNORMAL HIGH (ref 4.0–10.5)

## 2013-10-27 LAB — COMPREHENSIVE METABOLIC PANEL
ALT: 9 U/L (ref 0–35)
AST: 15 U/L (ref 0–37)
Albumin: 3.6 g/dL (ref 3.5–5.2)
Alkaline Phosphatase: 81 U/L (ref 39–117)
BUN: 6 mg/dL (ref 6–23)
CALCIUM: 10 mg/dL (ref 8.4–10.5)
CO2: 24 mEq/L (ref 19–32)
Chloride: 97 mEq/L (ref 96–112)
Creatinine, Ser: 0.62 mg/dL (ref 0.50–1.10)
GFR calc non Af Amer: 79 mL/min — ABNORMAL LOW (ref >90)
Glucose, Bld: 128 mg/dL — ABNORMAL HIGH (ref 70–99)
Potassium: 3.8 mEq/L (ref 3.7–5.3)
SODIUM: 137 meq/L (ref 137–147)
TOTAL PROTEIN: 6.6 g/dL (ref 6.0–8.3)
Total Bilirubin: 0.7 mg/dL (ref 0.3–1.2)

## 2013-10-27 LAB — PROTIME-INR
INR: 0.99 (ref 0.00–1.49)
Prothrombin Time: 12.9 seconds (ref 11.6–15.2)

## 2013-10-27 LAB — ABO/RH: ABO/RH(D): O POS

## 2013-10-27 LAB — PRO B NATRIURETIC PEPTIDE: Pro B Natriuretic peptide (BNP): 657.5 pg/mL — ABNORMAL HIGH (ref 0–450)

## 2013-10-27 MED ORDER — DEXTROSE 5 % IV SOLN
1.0000 g | Freq: Once | INTRAVENOUS | Status: AC
  Administered 2013-10-27: 1 g via INTRAVENOUS
  Filled 2013-10-27: qty 10

## 2013-10-27 MED ORDER — AZITHROMYCIN 500 MG IV SOLR
500.0000 mg | Freq: Once | INTRAVENOUS | Status: AC
  Administered 2013-10-28: 500 mg via INTRAVENOUS
  Filled 2013-10-27: qty 500

## 2013-10-27 MED ORDER — SODIUM CHLORIDE 0.9 % IV SOLN
1000.0000 mL | INTRAVENOUS | Status: DC
  Administered 2013-10-27: 1000 mL via INTRAVENOUS

## 2013-10-27 MED ORDER — ONDANSETRON HCL 4 MG/2ML IJ SOLN
4.0000 mg | Freq: Once | INTRAMUSCULAR | Status: AC
  Administered 2013-10-27: 4 mg via INTRAVENOUS
  Filled 2013-10-27: qty 2

## 2013-10-27 MED ORDER — DEXTROSE 5 % IV SOLN
1.0000 g | INTRAVENOUS | Status: DC
  Administered 2013-10-28 – 2013-10-30 (×3): 1 g via INTRAVENOUS
  Filled 2013-10-27 (×3): qty 1

## 2013-10-27 MED ORDER — POTASSIUM CHLORIDE IN NACL 20-0.9 MEQ/L-% IV SOLN
INTRAVENOUS | Status: DC
  Administered 2013-10-28 – 2013-10-29 (×3): via INTRAVENOUS
  Filled 2013-10-27 (×4): qty 1000

## 2013-10-27 MED ORDER — VANCOMYCIN HCL 500 MG IV SOLR
500.0000 mg | Freq: Two times a day (BID) | INTRAVENOUS | Status: DC
  Administered 2013-10-28 – 2013-10-29 (×5): 500 mg via INTRAVENOUS
  Filled 2013-10-27 (×7): qty 500

## 2013-10-27 MED ORDER — ENOXAPARIN SODIUM 40 MG/0.4ML ~~LOC~~ SOLN
40.0000 mg | Freq: Every day | SUBCUTANEOUS | Status: DC
  Administered 2013-10-28 – 2013-10-29 (×3): 40 mg via SUBCUTANEOUS
  Filled 2013-10-27 (×4): qty 0.4

## 2013-10-27 NOTE — ED Provider Notes (Signed)
CSN: [id number]     Arrival date & time 10/27/13  1637 History   First MD Initiated Contact with Patient 10/27/13 1640     Chief Complaint  Patient presents with  . Shortness of Breath   level V caveat: Confusion HPI Patient presents to the emergency room with complaints of " I don't feel good."  Currently there are no other relatives present at the bedside. The patient is unable to be more specific for me. I asked her if she was having trouble with chest pain abdominal pain or headache and she denied that. She does admit to having some trouble with nausea. She denies any trouble with her breathing however he chief complaint according to the EMS report is shortness of breath.  Patient was in the emergency room on January 29. At that time, she was having trouble with nausea vomiting and some dizziness. The patient was evaluated in the emergency room with laboratory tests as well as an abdominal/pelvic CT scan. The patient was noted to have an elevated white blood cell count. The CT scan showed periaortic adenopathy. She had an acute abdominal series that showed shallow lung volumes with the possibility of focal nodular infiltrates. Past Medical History  Diagnosis Date  . Cardiac dysrhythmia, unspecified   . Unspecified essential hypertension   . Other and unspecified hyperlipidemia   . Atrial fibrillation   . CAD (coronary artery disease)    Past Surgical History  Procedure Laterality Date  . Coronary artery bypass graft    . Abdominal hysterectomy    . Orthopedic surgery     Family History  Problem Relation Age of Onset  . Heart disease Father   . Heart disease Father   . Heart disease Sister     brother  . Cancer Sister     ovarian, throat  . Leukemia Brother    History  Substance Use Topics  . Smoking status: Never Smoker   . Smokeless tobacco: Never Used  . Alcohol Use: No   OB History   Grav Para Term Preterm Abortions TAB SAB Ect Mult Living                 Review of  Systems  All other systems reviewed and are negative.    Allergies  Review of patient's allergies indicates no known allergies.  Home Medications   Current Outpatient Rx  Name  Route  Sig  Dispense  Refill  . amoxicillin (AMOXIL) 875 MG tablet   Oral   Take 875 mg by mouth 2 (two) times daily. 10/25/13-11/01/13 7 day supply         . meclizine (ANTIVERT) 25 MG tablet   Oral   Take 1 tablet (25 mg total) by mouth 4 (four) times daily.   28 tablet   0   . ondansetron (ZOFRAN ODT) 4 MG disintegrating tablet   Oral   Take 1 tablet (4 mg total) by mouth every 8 (eight) hours as needed for nausea or vomiting.   12 tablet   1   . triamcinolone cream (KENALOG) 0.1 %   Topical   Apply 1 application topically daily as needed (to fave for eczema).          BP 127/55  Pulse 84  Temp(Src) 99.2 F (37.3 C) (Oral)  Resp 24  SpO2 95% Physical Exam  Nursing note and vitals reviewed. Constitutional: She appears well-developed and well-nourished. No distress.  HENT:  Head: Normocephalic and atraumatic.  Right Ear: External  ear normal.  Left Ear: External ear normal.  Mouth/Throat: Oropharynx is clear and moist. No oropharyngeal exudate.  Eyes: Conjunctivae are normal. Right eye exhibits no discharge. Left eye exhibits no discharge. No scleral icterus.  Neck: Neck supple. No tracheal deviation present.  Cardiovascular: Normal rate and intact distal pulses.  An irregularly irregular rhythm present.  Pulmonary/Chest: Effort normal and breath sounds normal. No stridor. No respiratory distress. She has no wheezes. She has no rales.  Abdominal: Soft. Bowel sounds are normal. She exhibits no distension. There is no tenderness. There is no rebound and no guarding.  Musculoskeletal: She exhibits no edema and no tenderness.  Neurological: She is alert. No cranial nerve deficit (no facial drrop, EOMI) or sensory deficit. She exhibits normal muscle tone. She displays no seizure activity.  Coordination normal.  No pronator drift bilateral upper extrem, able to hold both legs off bed for 5 seconds, sensation intact in all extremities, no visual field cuts, no left or right sided neglect, normal finger-nose exam bilaterally, no nystagmus noted, follows commands, unaware of date, generalized weakness   Skin: Skin is warm and dry. No rash noted. She is not diaphoretic. There is pallor.  Psychiatric: She has a normal mood and affect.    ED Course  Procedures (including critical care time)  Labs Review Labs Reviewed  CBC WITH DIFFERENTIAL - Abnormal; Notable for the following:    WBC 24.3 (*)    RBC 5.20 (*)    Neutrophils Relative % 90 (*)    Neutro Abs 21.9 (*)    Lymphocytes Relative 5 (*)    Monocytes Absolute 1.3 (*)    All other components within normal limits  COMPREHENSIVE METABOLIC PANEL - Abnormal; Notable for the following:    Glucose, Bld 128 (*)    GFR calc non Af Amer 79 (*)    All other components within normal limits  URINALYSIS, ROUTINE W REFLEX MICROSCOPIC - Abnormal; Notable for the following:    APPearance CLOUDY (*)    All other components within normal limits  PRO B NATRIURETIC PEPTIDE - Abnormal; Notable for the following:    Pro B Natriuretic peptide (BNP) 657.5 (*)    All other components within normal limits  PROTIME-INR  INFLUENZA PANEL BY PCR  POCT I-STAT TROPONIN I  TYPE AND SCREEN  ABO/RH   Imaging Review Dg Chest 2 View  10/27/2013   CLINICAL DATA:  Atrial fibrillation.  Shortness of breath.  EXAM: CHEST  2 VIEW  COMPARISON:  Chest x-ray 10/23/2013 and 12/13/2008.  FINDINGS: Previously identified regions of discoid atelectasis and/or infiltrates and have largely cleared with minimal residual. A questionable density is present in the upper aspect of the right upper lobe. Follow-up chest x-ray recommended demonstrate complete clearing of the mild residual basilar atelectatic changes and of the a questionable infiltrate or focal lesion in the  right upper lobe. Heart size and pulmonary vascularity normal. No pleural effusion or pneumothorax. No acute bony abnormality.  IMPRESSION: 1. Interval near-complete clearing of basilar atelectasis and/or infiltrates. There is a questionable infiltrate versus focal pulmonary lesion in the right upper lobe. Continued follow-up chest x-ray is recommended to demonstrate clearing of the right upper lobe and minimal residual basilar atelectasis/infiltrates .  2. Prior CABG.  No evidence of CHF.   Electronically Signed   By: Maisie Fushomas  Register   On: 10/27/2013 17:45   Ct Head Wo Contrast  10/27/2013   CLINICAL DATA:  Altered mental status.  Confusion.  EXAM: CT HEAD  WITHOUT CONTRAST  TECHNIQUE: Contiguous axial images were obtained from the base of the skull through the vertex without intravenous contrast.  COMPARISON:  11/10/2011  FINDINGS: Despite efforts by the technologist and patient, motion artifact is present on today's exam and could not be eliminated. This reduces exam sensitivity and specificity. The brainstem, cerebellum, cerebral peduncles, thalamus, basal ganglia, basilar cisterns, and ventricular system appear within normal limits. Periventricular white matter and corona radiata hypodensities favor chronic ischemic microvascular white matter disease. No intracranial hemorrhage, mass lesion, or acute CVA. Visualized paranasal sinuses appear clear.  IMPRESSION: 1. No acute intracranial findings. 2. Periventricular white matter and corona radiata hypodensities favor chronic ischemic microvascular white matter disease.   Electronically Signed   By: Herbie Baltimore M.D.   On: 10/27/2013 17:53    EKG Interpretation    Date/Time:  Friday October 27 2013 17:15:55 EST Ventricular Rate:  90 PR Interval:  135 QRS Duration: 97 QT Interval:  345 QTC Calculation: 422 R Axis:   -25 Text Interpretation:  Sinus tachycardia Atrial premature complexes Borderline left axis deviation Anteroseptal infarct, old Repol  abnrm suggests ischemia, lateral leads Since last tracing rate faster Confirmed by Corey Laski  MD-J, Hyatt Capobianco ([id number]) on 10/27/2013 5:33:26 PM            MDM   1. CAP (community acquired pneumonia)   2. Weakness   3. Leukocytosis    ? PNA on CXR.  Pt with elevated WBC count, weakness and fatigue.  Will start iv abx, add flu panel.  Consult with hospitalist regarding admission.    Celene Kras, MD 10/27/13 2029

## 2013-10-27 NOTE — Progress Notes (Signed)
ANTIBIOTIC CONSULT NOTE - INITIAL  Pharmacy Consult for Vancomycin & renal adjustment of other antibiotics Indication: pneumonia  No Known Allergies  Patient Measurements: Height: 5\' 2"  (157.5 cm) Weight: 144 lb 13.5 oz (65.7 kg) IBW/kg (Calculated) : 50.1  Vital Signs: Temp: 97.8 F (36.6 C) (01/02 2248) Temp src: Oral (01/02 2248) BP: 110/51 mmHg (01/02 2248) Pulse Rate: 71 (01/02 2248) Intake/Output from previous day:   Intake/Output from this shift:    Labs:  Recent Labs  10/27/13 1750  WBC 24.3*  HGB 13.8  PLT 365  CREATININE 0.62   Estimated Creatinine Clearance: 44 ml/min (by C-G formula based on Cr of 0.62). No results found for this basename: VANCOTROUGH, VANCOPEAK, VANCORANDOM, GENTTROUGH, GENTPEAK, GENTRANDOM, TOBRATROUGH, TOBRAPEAK, TOBRARND, AMIKACINPEAK, AMIKACINTROU, AMIKACIN,  in the last 72 hours   Microbiology: No results found for this or any previous visit (from the past 720 hour(s)).  Medical History: Past Medical History  Diagnosis Date  . Cardiac dysrhythmia, unspecified   . Unspecified essential hypertension   . Other and unspecified hyperlipidemia   . Atrial fibrillation   . CAD (coronary artery disease)   . Complication of anesthesia     Takes a long time to wake up  . Dementia     Medications:  Scheduled:  . azithromycin  500 mg Intravenous Once  . ceFEPime (MAXIPIME) IV  1 g Intravenous Q8H  . enoxaparin (LOVENOX) injection  40 mg Subcutaneous Q24H   Infusions:  . sodium chloride 1,000 mL (10/27/13 1830)  . 0.9 % NaCl with KCl 20 mEq / L     Assessment:  [age] yr female with complaint of worsening shortness of breath  WBC = 24.3, currently afebrile (reported fever at home)  CrCl ~ 44 ml/min  Chest Xray cannot rule out pneumonia  Received Rocephin 1gm in ED @ 20:45; Azithromycin 500mg  x 1 also ordered in ED  Upon admission, Vancomycin per pharmacy dosing ordered for pneumonia x 8 days.  Pharmacy also asked to adjust  other antibiotics based on renal function - currently Cefepime 1gm IV q8h x 8 days also ordered  Goal of Therapy:  Vancomycin trough level 15-20 mcg/ml  Plan:  Measure antibiotic drug levels at steady state Follow up culture results Vancomycin 500mg  IV q12h x 8 days Adjust Cefepime to 1gm IV q24h x 8 days (as CrCl between 30-50 ml/min)  Dazha Kempa, Joselyn GlassmanLeann Trefz, PharmD 10/27/2013,11:01 PM

## 2013-10-27 NOTE — ED Notes (Signed)
Patient transported to X-ray 

## 2013-10-27 NOTE — ED Notes (Signed)
Bed: WA09 Expected date:  Expected time:  Means of arrival:  Comments: EMS-SOB 

## 2013-10-27 NOTE — H&P (Signed)
[facility] History and Physical  SHAKEYLA GIEBLER ZOX:[id number] DOB: Jun 08, 1924 DOA: 10/27/2013  Referring physician: Dr. Lynelle Doctor, ED PCP: Ginette Otto, MD  Specialists: n/a  Chief Complaint: Increasing shortness of breath  HPI: [name] is a 80 y.o. female  This is a very pleasant 80 year old patient who comes to [place] with increasing shortness of breath today. 2-3 days ago she had sudden onset of nausea and vomiting and was evaluated in the emergency department. At that time she had a mild leukocytosis of 13.8 and CT of the abdomen and pelvic reveals nonspecific lymphadenopathy in which they could not rule out acute mesenteric adenitis. Nausea showed diverticulosis without diverticulitis and cholelithiasis without Acute cholecystitis. The patient was discharged with Antivert and Zofran. Today she developed progressively worse shortness of breath. Prior to today she has not had any shortness of breath. She still has some nausea but the vomiting has greatly improved. She walks with a walker and today was unable to go more than a couple of steps due to the shortness of breath. She also has been coughing. She had a fever at home to 100.3. In the emergency department she was found to have a respiratory rate of 24, a white count of 24.3, and chest x-ray in which right upper lobe pneumonia cannot be ruled out.  Review of Systems: The patient denies anorexia, weight loss,, vision loss, decreased hearing, hoarseness, chest pain, syncope,  peripheral edema, balance deficits, hemoptysis, abdominal pain, melena, hematochezia, severe indigestion/heartburn, hematuria, incontinence, genital sores, muscle weakness, suspicious skin lesions, transient blindness, difficulty walking, depression, unusual weight change, abnormal bleeding, enlarged lymph nodes, angioedema, and breast masses.    Past Medical History  Diagnosis Date  . Cardiac dysrhythmia, unspecified   . Unspecified essential hypertension    . Other and unspecified hyperlipidemia   . Atrial fibrillation   . CAD (coronary artery disease)   . Complication of anesthesia     Takes a long time to wake up  . Dementia    Past Surgical History  Procedure Laterality Date  . Coronary artery bypass graft    . Abdominal hysterectomy    . Orthopedic surgery     Social History:  reports that she has never smoked. She has never used smokeless tobacco. She reports that she does not drink alcohol or use illicit drugs. She lives in an assisted living facility and requires help with most of her ADLs at baseline.  No Known Allergies  Family History  Problem Relation Age of Onset  . Heart disease Father   . Heart disease Father   . Heart disease Sister     brother  . Cancer Sister     ovarian, throat  . Leukemia Brother   )  Prior to Admission medications   Medication Sig Start Date End Date Taking? Authorizing Provider  amoxicillin (AMOXIL) 875 MG tablet Take 875 mg by mouth 2 (two) times daily. 10/25/13-11/01/13 7 day supply   Yes Historical Provider, MD  meclizine (ANTIVERT) 25 MG tablet Take 1 tablet (25 mg total) by mouth 4 (four) times daily. 10/23/13  Yes Shelda Jakes, MD  ondansetron (ZOFRAN ODT) 4 MG disintegrating tablet Take 1 tablet (4 mg total) by mouth every 8 (eight) hours as needed for nausea or vomiting. 10/23/13  Yes Shelda Jakes, MD  triamcinolone cream (KENALOG) 0.1 % Apply 1 application topically daily as needed (to fave for eczema).   Yes Historical Provider, MD   Physical Exam: Filed Vitals:  10/27/13 1915  BP: 127/55  Pulse: 84  Temp:   Resp: 24    Nursing note and vitals reviewed. Constitutional: She is oriented to person. She is not oriented to place or time. Per family this is baseline. She appears well-developed and well-nourished.  HENT:  Nose: Nose normal.  Mouth/Throat: Oropharynx is clear and moist. No oropharyngeal exudate.  Eyes: Conjunctivae are normal. Pupils are equal, round,  and reactive to light.  Neck: Normal range of motion. Neck supple. No thyromegaly present.  Cardiovascular: Normal rate, regular rhythm and normal heart sounds.   Pulmonary/Chest: Slightly increased work of breathing. Slightly tachypneic at 22 on my exam. Lungs are clear but patient unable to take a deep breath due to the confusion. Abdominal: Soft. Bowel sounds are normal. She exhibits no distension. There is no tenderness. There is no rebound.  Lymphadenopathy:    She has no cervical adenopathy.  Neurological: She is alert and oriented to person, place, and time. She has normal reflexes.  Skin: Skin is warm and dry.  trace edema distal one third of bilateral lower extremities  Psychiatric: She has a normal mood and affect for her. Her behavior is normal.    Labs on Admission:  Basic Metabolic Panel:  Recent Labs Lab 10/23/13 0935 10/27/13 1750  NA 141 137  K 3.6 3.8  CL 103 97  CO2 29 24  GLUCOSE 118* 128*  BUN 8 6  CREATININE 0.71 0.62  CALCIUM 9.5 10.0   Liver Function Tests:  Recent Labs Lab 10/23/13 0935 10/27/13 1750  AST 14 15  ALT 9 9  ALKPHOS 85 81  BILITOT 0.4 0.7  PROT 6.5 6.6  ALBUMIN 3.7 3.6    Recent Labs Lab 10/23/13 0935  LIPASE 27   No results found for this basename: AMMONIA,  in the last 168 hours CBC:  Recent Labs Lab 10/23/13 0935 10/27/13 1750  WBC 13.8* 24.3*  NEUTROABS 11.2* 21.9*  HGB 13.1 13.8  HCT 40.7 42.8  MCV 82.7 82.3  PLT 337 365   Cardiac Enzymes: No results found for this basename: CKTOTAL, CKMB, CKMBINDEX, TROPONINI,  in the last 168 hours  BNP (last 3 results)  Recent Labs  10/27/13 1750  PROBNP 657.5*   CBG: No results found for this basename: GLUCAP,  in the last 168 hours  Radiological Exams on Admission: Dg Chest 2 View  10/27/2013   CLINICAL DATA:  Atrial fibrillation.  Shortness of breath.  EXAM: CHEST  2 VIEW  COMPARISON:  Chest x-ray 10/23/2013 and 12/13/2008.  FINDINGS: Previously identified  regions of discoid atelectasis and/or infiltrates and have largely cleared with minimal residual. A questionable density is present in the upper aspect of the right upper lobe. Follow-up chest x-ray recommended demonstrate complete clearing of the mild residual basilar atelectatic changes and of the a questionable infiltrate or focal lesion in the right upper lobe. Heart size and pulmonary vascularity normal. No pleural effusion or pneumothorax. No acute bony abnormality.  IMPRESSION: 1. Interval near-complete clearing of basilar atelectasis and/or infiltrates. There is a questionable infiltrate versus focal pulmonary lesion in the right upper lobe. Continued follow-up chest x-ray is recommended to demonstrate clearing of the right upper lobe and minimal residual basilar atelectasis/infiltrates .  2. Prior CABG.  No evidence of CHF.   Electronically Signed   By: Maisie Fushomas  Register   On: 10/27/2013 17:45   Ct Head Wo Contrast  10/27/2013   CLINICAL DATA:  Altered mental status.  Confusion.  EXAM: CT  HEAD WITHOUT CONTRAST  TECHNIQUE: Contiguous axial images were obtained from the base of the skull through the vertex without intravenous contrast.  COMPARISON:  11/10/2011  FINDINGS: Despite efforts by the technologist and patient, motion artifact is present on today's exam and could not be eliminated. This reduces exam sensitivity and specificity. The brainstem, cerebellum, cerebral peduncles, thalamus, basal ganglia, basilar cisterns, and ventricular system appear within normal limits. Periventricular white matter and corona radiata hypodensities favor chronic ischemic microvascular white matter disease. No intracranial hemorrhage, mass lesion, or acute CVA. Visualized paranasal sinuses appear clear.  IMPRESSION: 1. No acute intracranial findings. 2. Periventricular white matter and corona radiata hypodensities favor chronic ischemic microvascular white matter disease.   Electronically Signed   By: Herbie Baltimore  M.D.   On: 10/27/2013 17:53    EKG: Independently reviewed. Sinus rhythm   Assessment/Plan Active Problems:   HYPERLIPIDEMIA   HYPERTENSION   Atrial fibrillation   COPD, MILD   DYSPNEA   HCAP    CAD (coronary artery disease)   Leukocytosis   Difficulty swallowing solids   Dementia   Vertigo   Abscessed tooth   Diverticulosis   Asymptomatic cholelithiasis   1. Fever and leukocytosis: Most likely healthcare associated pneumonia in this patient given that she lives at an assisted-living facility we'll treat as healthcare associated rather than community-acquired. Will follow the flu swab. Follow blood cultures. Treat empirically with antibiotics. Check lactate. Tachypnea seems better with some oxygen. We'll watch her sats. 2. History of atrial fibrillation: Patient is not in A. fib right now. We'll follow her on telemetry closely 3. Elevated BNP: 657.5 today patient has not had a history of congestive heart failure. Will check an echo. 4. Slightly low creatinine clearance: GFR is 79. Creatinine clearance is roughly 50 depending on the patient's actual weight. We don't have labs from between 2010 and 3 days ago so it's hard to tell if this is a new problem or not. 5. Dementia: We'll maintain fall precautions. 6. Dysphagia: Per the family this is a manifestation of her dementia. We'll allow her a soft diet. 7. Tooth infection: The patient is on the fourth day to day of amoxicillin for tooth infection. She does not complaining of tooth pain at this time. We'll stop the amoxicillin but the antibiotics for her pneumonia should cover the tooth infection just fine. 8. New findings of diverticulosis and cholelithiasis (without diverticulitis or cholecystitis: No interventions do this time.  No consults  Code Status: dnr Family Communication: I spoke with the daughters. They were very helpful. Disposition Plan: Admit this patient to telemetry  Time spent: 50 minutes  Acey Lav [facility] Pager [phone]  If 7PM-7AM, please contact night-coverage [email or web address] Password [facility] 10/27/2013, 9:41 PM

## 2013-10-27 NOTE — ED Notes (Signed)
Per EMS pt coming from home with c/o shortness of breath. Per EMS pt denies any complaints at this time. Per EMS pt [facility].

## 2013-10-28 DIAGNOSIS — R1310 Dysphagia, unspecified: Secondary | ICD-10-CM

## 2013-10-28 DIAGNOSIS — I369 Nonrheumatic tricuspid valve disorder, unspecified: Secondary | ICD-10-CM

## 2013-10-28 LAB — CBC
HCT: 36.1 % (ref 36.0–46.0)
Hemoglobin: 11 g/dL — ABNORMAL LOW (ref 12.0–15.0)
MCH: 25.4 pg — AB (ref 26.0–34.0)
MCHC: 30.5 g/dL (ref 30.0–36.0)
MCV: 83.4 fL (ref 78.0–100.0)
Platelets: 293 10*3/uL (ref 150–400)
RBC: 4.33 MIL/uL (ref 3.87–5.11)
RDW: 15 % (ref 11.5–15.5)
WBC: 13.4 10*3/uL — ABNORMAL HIGH (ref 4.0–10.5)

## 2013-10-28 LAB — BASIC METABOLIC PANEL
BUN: 8 mg/dL (ref 6–23)
CO2: 29 meq/L (ref 19–32)
Calcium: 9.1 mg/dL (ref 8.4–10.5)
Chloride: 100 mEq/L (ref 96–112)
Creatinine, Ser: 0.76 mg/dL (ref 0.50–1.10)
GFR calc Af Amer: 85 mL/min — ABNORMAL LOW (ref >90)
GFR, EST NON AFRICAN AMERICAN: 74 mL/min — AB (ref >90)
GLUCOSE: 116 mg/dL — AB (ref 70–99)
Potassium: 3.6 mEq/L — ABNORMAL LOW (ref 3.7–5.3)
SODIUM: 137 meq/L (ref 137–147)

## 2013-10-28 LAB — C-REACTIVE PROTEIN: CRP: 6.1 mg/dL — ABNORMAL HIGH (ref <0.60)

## 2013-10-28 LAB — MAGNESIUM: Magnesium: 2 mg/dL (ref 1.5–2.5)

## 2013-10-28 LAB — INFLUENZA PANEL BY PCR (TYPE A & B)
H1N1 flu by pcr: NOT DETECTED
INFLBPCR: NEGATIVE
Influenza A By PCR: NEGATIVE

## 2013-10-28 LAB — CREATININE, SERUM
Creatinine, Ser: 0.72 mg/dL (ref 0.50–1.10)
GFR calc Af Amer: 87 mL/min — ABNORMAL LOW (ref >90)
GFR calc non Af Amer: 75 mL/min — ABNORMAL LOW (ref >90)

## 2013-10-28 LAB — PHOSPHORUS: Phosphorus: 3.4 mg/dL (ref 2.3–4.6)

## 2013-10-28 LAB — HIV ANTIBODY (ROUTINE TESTING W REFLEX): HIV: NONREACTIVE

## 2013-10-28 LAB — TSH: TSH: 3.209 u[IU]/mL (ref 0.350–4.500)

## 2013-10-28 LAB — LACTIC ACID, PLASMA: LACTIC ACID, VENOUS: 1.9 mmol/L (ref 0.5–2.2)

## 2013-10-28 LAB — MRSA PCR SCREENING: MRSA by PCR: NEGATIVE

## 2013-10-28 LAB — STREP PNEUMONIAE URINARY ANTIGEN: Strep Pneumo Urinary Antigen: NEGATIVE

## 2013-10-28 MED ORDER — LORAZEPAM 0.5 MG PO TABS
0.5000 mg | ORAL_TABLET | Freq: Once | ORAL | Status: AC
  Administered 2013-10-28: 02:00:00 0.5 mg via ORAL
  Filled 2013-10-28: qty 1

## 2013-10-28 NOTE — Evaluation (Signed)
Physical Therapy Evaluation Patient Details Name: [name] MRN: [id number] DOB: Jan 09, 1924 Today's Date: 10/28/2013 Time: [id number] PT Time Calculation (min): 19 min  PT Assessment / Plan / Recommendation History of Present Illness  80 yo female admitted with pna, weakness. Hx of afib, cad. Pt is from ALF  Clinical Impression  On eval, pt required Mod assist for mobility-able to ambulate ~135 feet with walker. O2 sats 89% on RA. Demonstrates general weakness, and impaired gait and balance. No family present during session    PT Assessment  Patient needs continued PT services    Follow Up Recommendations  Home health PT;Supervision/Assistance - 24 hour (at facility as long as they can provide current level of care. If unable, then will need SNF)    Does the patient have the potential to tolerate intense rehabilitation      Barriers to Discharge        Equipment Recommendations  None recommended by PT    Recommendations for Other Services OT consult   Frequency Min 3X/week    Precautions / Restrictions Precautions Precautions: Fall Restrictions Weight Bearing Restrictions: No   Pertinent Vitals/Pain No c/o pain      Mobility  Bed Mobility Bed Mobility: Supine to Sit Supine to Sit: 3: Mod assist Details for Bed Mobility Assistance: Increased time. Assist for LEs and trunk. Utilized bedpad for scooting, positioning.  Transfers Transfers: Sit to Stand;Stand to Sit Sit to Stand: 3: Mod assist;From bed Stand to Sit: To chair/3-in-1;4: Min assist Details for Transfer Assistance: Assist to rise, stabilize, control descent. Multimodal cues for safety, technique, hand placmeent.  Ambulation/Gait Ambulation/Gait Assistance: 4: Min assist Ambulation Distance (Feet): 135 Feet Assistive device: Rolling walker Ambulation/Gait Assistance Details: slow gait speed. assist to stabilize pt and maneuver with walker throuhgout ambulation. O2 sats 89% on rA.  Gait Pattern:  Step-through pattern;Trunk flexed;Decreased stride length    Exercises     PT Diagnosis: Difficulty walking;Generalized weakness  PT Problem List: Decreased strength;Decreased balance;Decreased mobility;Decreased knowledge of use of DME;Decreased safety awareness;Decreased cognition PT Treatment Interventions: DME instruction;Gait training;Functional mobility training;Therapeutic activities;Therapeutic exercise;Balance training;Patient/family education     PT Goals(Current goals can be found in the care plan section) Acute Rehab PT Goals Patient Stated Goal: none stated PT Goal Formulation: Patient unable to participate in goal setting Time For Goal Achievement: 11/11/13 Potential to Achieve Goals: Good  Visit Information  Last PT Received On: 10/28/13 Assistance Needed: +1 History of Present Illness: 80 yo female admitted with pna, weakness. Hx of afib, cad. Pt is from ALF       Prior Functioning  Home Living Family/patient expects to be discharged to:: Assisted living Home Equipment: Walker - 2 wheels Prior Function Level of Independence: Needs assistance Communication Communication: HOH    Cognition  Cognition Arousal/Alertness: Awake/alert Behavior During Therapy: WFL for tasks assessed/performed Overall Cognitive Status: No family/caregiver present to determine baseline cognitive functioning    Extremity/Trunk Assessment Upper Extremity Assessment Upper Extremity Assessment: Generalized weakness Lower Extremity Assessment Lower Extremity Assessment: Generalized weakness Cervical / Trunk Assessment Cervical / Trunk Assessment: Kyphotic   Balance    End of Session PT - End of Session Equipment Utilized During Treatment: Gait belt Activity Tolerance: Patient tolerated treatment well Patient left: in chair;with call bell/phone within reach;with nursing/sitter in room  GP     [name], MPT Pager: [phone]

## 2013-10-28 NOTE — Progress Notes (Addendum)
[facility] PROGRESS NOTE   [name]:[id number] DOB: 05-Sep-1924 DOA: 10/27/2013 PCP: Ginette Otto, MD  Brief narrative: [name] is an 80 y.o. female with a PMH of atrial fibrillation, coronary artery disease, COPD and dementia who was admitted on 10/28/2012 with increasing shortness of breath and fever.  Initial evaluation with chest radiography in the ED showed a right sided pneumonia.  Assessment/Plan: Principal Problem:   HCAP (healthcare-associated pneumonia) / dyspnea / leukocytosis Patient was admitted and a serum lactate level was checked and found to be within normal limits. Strep pneumonia antigen and HIV serologies negative. Legionella antigen studies pending. Influenza PCR studies negative. Currently being treated with cefepime and vancomycin. Active Problems:   Atrial fibrillation Admission EKG showed sinus tachycardia with premature atrial complexes. Not on any rate controlling medications were therapeutic anticoagulation.   COPD, MILD Consider bronchodilators if indicated.   Difficulty swallowing solids Speech therapy evaluation requested.   Dementia Stable.   Vertigo Antivert if needed.   Tooth infection Was on amoxicillin as an outpatient.  Code Status: DNR Family Communication: No family at the bedside.  Daughter, Mauro Kaufmann [phone]) called, message left. Disposition Plan: From ALF with 24 hour care with aides.   IV access:  Peripheral IV  Medical Consultants:  None  Other Consultants:  Speech therapy  Anti-infectives:  Rocephin 10/27/13---> 10/27/13  Azithromycin 10/27/13---> 10/27/13  Cefepime 10/27/13--->  Vancomycin 10/27/13--->  HPI/Subjective: [name] denies dyspnea.  Occasional cough.  A bit sedated (aide tells me she was given Ativan in the night).  No nausea, vomiting, diarrhea.  No complaints of pain.  Objective: Filed Vitals:   10/27/13 2115 10/27/13 2145 10/27/13 2248 10/28/13 0619  BP:   110/51 135/56   Pulse: 82 81 71 72  Temp:   97.8 F (36.6 C) 98.3 F (36.8 C)  TempSrc:   Oral Oral  Resp: 21 17 16 20   Height:   5\' 2"  (1.575 m)   Weight:   65.7 kg (144 lb 13.5 oz)   SpO2: 96% 96% 97% 92%    Intake/Output Summary (Last 24 hours) at 10/28/13 8657 Last data filed at 10/28/13 8469  Gross per 24 hour  Intake    270 ml  Output    500 ml  Net   -230 ml    Exam: Gen:  NAD Cardiovascular:  RRR, No M/R/G Respiratory:  Lungs diminished Gastrointestinal:  Abdomen softly distended with hyperactive BS, NT. Extremities:  No C/E/C  Data Reviewed: Basic Metabolic Panel:  Recent Labs Lab 10/23/13 0935 10/27/13 1750 10/27/13 2325 10/28/13 0747  NA 141 137  --  137  K 3.6 3.8  --  3.6*  CL 103 97  --  100  CO2 29 24  --  29  GLUCOSE 118* 128*  --  116*  BUN 8 6  --  8  CREATININE 0.71 0.62 0.72 0.76  CALCIUM 9.5 10.0  --  9.1  MG  --   --  2.0  --   PHOS  --   --  3.4  --    GFR Estimated Creatinine Clearance: 44 ml/min (by C-G formula based on Cr of 0.76). Liver Function Tests:  Recent Labs Lab 10/23/13 0935 10/27/13 1750  AST 14 15  ALT 9 9  ALKPHOS 85 81  BILITOT 0.4 0.7  PROT 6.5 6.6  ALBUMIN 3.7 3.6    Recent Labs Lab 10/23/13 0935  LIPASE 27   Coagulation profile  Recent Labs Lab 10/27/13  1750  INR 0.99    CBC:  Recent Labs Lab 10/23/13 0935 10/27/13 1750 10/27/13 2325 10/28/13 0747  WBC 13.8* 24.3* 21.8* 13.4*  NEUTROABS 11.2* 21.9*  --   --   HGB 13.1 13.8 12.2 11.0*  HCT 40.7 42.8 38.8 36.1  MCV 82.7 82.3 82.9 83.4  PLT 337 365 356 293   BNP (last 3 results)  Recent Labs  10/27/13 1750  PROBNP 657.5*   Thyroid function studies  Recent Labs  10/27/13 2325  TSH 3.209   Microbiology Recent Results (from the past 240 hour(s))  MRSA PCR SCREENING     Status: None   Collection Time    10/27/13 11:11 PM      Result Value Range Status   MRSA by PCR NEGATIVE  NEGATIVE Final   Comment:            The GeneXpert MRSA Assay  (FDA     approved for NASAL specimens     only), is one component of a     comprehensive MRSA colonization     surveillance program. It is not     intended to diagnose MRSA     infection nor to guide or     monitor treatment for     MRSA infections.     Procedures and Diagnostic Studies: Dg Chest 2 View  10/27/2013   CLINICAL DATA:  Atrial fibrillation.  Shortness of breath.  EXAM: CHEST  2 VIEW  COMPARISON:  Chest x-ray 10/23/2013 and 12/13/2008.  FINDINGS: Previously identified regions of discoid atelectasis and/or infiltrates and have largely cleared with minimal residual. A questionable density is present in the upper aspect of the right upper lobe. Follow-up chest x-ray recommended demonstrate complete clearing of the mild residual basilar atelectatic changes and of the a questionable infiltrate or focal lesion in the right upper lobe. Heart size and pulmonary vascularity normal. No pleural effusion or pneumothorax. No acute bony abnormality.  IMPRESSION: 1. Interval near-complete clearing of basilar atelectasis and/or infiltrates. There is a questionable infiltrate versus focal pulmonary lesion in the right upper lobe. Continued follow-up chest x-ray is recommended to demonstrate clearing of the right upper lobe and minimal residual basilar atelectasis/infiltrates .  2. Prior CABG.  No evidence of CHF.   Electronically Signed   By: Maisie Fus  Register   On: 10/27/2013 17:45   Ct Head Wo Contrast  10/27/2013   CLINICAL DATA:  Altered mental status.  Confusion.  EXAM: CT HEAD WITHOUT CONTRAST  TECHNIQUE: Contiguous axial images were obtained from the base of the skull through the vertex without intravenous contrast.  COMPARISON:  11/10/2011  FINDINGS: Despite efforts by the technologist and patient, motion artifact is present on today's exam and could not be eliminated. This reduces exam sensitivity and specificity. The brainstem, cerebellum, cerebral peduncles, thalamus, basal ganglia, basilar  cisterns, and ventricular system appear within normal limits. Periventricular white matter and corona radiata hypodensities favor chronic ischemic microvascular white matter disease. No intracranial hemorrhage, mass lesion, or acute CVA. Visualized paranasal sinuses appear clear.  IMPRESSION: 1. No acute intracranial findings. 2. Periventricular white matter and corona radiata hypodensities favor chronic ischemic microvascular white matter disease.   Electronically Signed   By: Herbie Baltimore M.D.   On: 10/27/2013 17:53   Ct Abdomen Pelvis W Contrast  10/23/2013   CLINICAL DATA:  Nausea and vomiting.  EXAM: CT ABDOMEN AND PELVIS WITH CONTRAST  TECHNIQUE: Multidetector CT imaging of the abdomen and pelvis was performed using the standard  protocol following bolus administration of intravenous contrast.  CONTRAST:  80mL OMNIPAQUE IOHEXOL 300 MG/ML  SOLN  COMPARISON:  Prior radiograph performed earlier on the same day  FINDINGS: Bibasilar atelectasis and/ scarring is present. No focal infiltrate seen within the visualized lung bases. Cardiomegaly with sequelae of prior bypass is noted. No pericardial effusion. Diffuse 3 vessel coronary artery calcifications are partially visualized. Median sternotomy noted.  Liver demonstrates a normal contrast enhanced appearance. No focal intrahepatic lesions. Calcified gallstones noted within the gallbladder without evidence of acute cholecystitis. No biliary ductal dilatation the spleen is within normal limits. Adrenal glands and pancreas demonstrate a normal contrast enhanced appearance.  Kidneys are symmetric in size with symmetric enhancement. No nephrolithiasis, hydronephrosis, or focal enhancing renal mass identified.  Small hiatal hernia is noted. There is no evidence of bowel obstruction. Stomach is within normal limits. No abnormal wall thickening or inflammatory fat stranding seen about the small or large bowel. Scattered colonic diverticula are noted without acute  diverticulitis. The appendix is not definitely visualize, however, no inflammatory changes are seen within the right lower quadrant or about the cecum to suggest acute appendicitis. Small fat containing periumbilical hernia is noted.  Mild diffuse this the mesenteric stranding noted within the mid abdomen. There is an enlarged 1.8 cm right periaortic lymph node (series 2, image 37). Additional shotty periaortic adenopathy measuring up to 1.1 cm is noted. This finding is of uncertain etiology, and may be reactive.  Bladder is within normal limits. Uterus and ovaries are not visualized.  No free air or fluid identified.  Heavy aorto bi-iliac atherosclerotic calcifications noted.  Multilevel degenerative changes noted within the visualized thoracolumbar spine. No focal lytic or blastic osseous lesions identified.  IMPRESSION: 1. Periaortic adenopathy measuring up to 1.9 cm in short axis with misty mesenteric fat stranding. Finding is of uncertain etiology, but may reflect acute mesenteric adenitis. Clinical correlation is recommended. A short interval followup scan could be considered to ensure resolution as clinically indicated. 2. Colonic diverticulosis. No CT evidence of acute diverticulitis or colitis identified. 3. Cholelithiasis without CT evidence of acute cholecystitis. 4. Small hiatal hernia.   Electronically Signed   By: Rise MuBenjamin  McClintock M.D.   On: 10/23/2013 14:11   Dg Abd Acute W/chest  10/23/2013   CLINICAL DATA:  Nausea, abdominal pain  EXAM: ACUTE ABDOMEN SERIES (ABDOMEN 2 VIEW & CHEST 1 VIEW)  COMPARISON:  12/13/2008  FINDINGS: Low lung volumes. Prominence of interstitial markings. Nodular areas of increased density projects within the right perihilar region and medial aspect of the right lung apex. The cardiac silhouette is enlarged. Areas of discoid atelectasis project within the right left lung bases. Atherosclerotic calcifications are appreciated within the aorta. Patient is status post  median sternotomy and coronary artery bypass grafting.  Air is seen within nondilated loops of large small bowel. A moderate to large amount of stool projects within the colon. Rounded calcified densities project within the right quadrant may represent calcified gallstones.  IMPRESSION: Low lung volumes. The described findings in the lungs may be secondary to the shallow lung volumes. Alternatively focal nodular infiltrates versus nodules or masses in the right hilar and right apical regions cannot be excluded. Further evaluation dedicated PA and lateral views of chest is recommended. Nonobstructive bowel gas pattern with a moderate amount of stool.   Electronically Signed   By: Salome HolmesHector  Cooper M.D.   On: 10/23/2013 10:30    Scheduled Meds: . ceFEPime (MAXIPIME) IV  1 g Intravenous Q24H  .  enoxaparin (LOVENOX) injection  40 mg Subcutaneous QHS  . vancomycin  500 mg Intravenous Q12H   Continuous Infusions: . sodium chloride 1,000 mL (10/27/13 1830)  . 0.9 % NaCl with KCl 20 mEq / L 75 mL/hr at 10/28/13 0114    Time spent: 30 minutes.   LOS: 1 day   RAMA,CHRISTINA  [facility] Pager [phone].   *Please note that the hospitalists switch teams on Wednesdays. Please call the flow manager at [phone] if you are having difficulty reaching the hospitalist taking care of this patient as she can update you and provide the most up-to-date pager number of provider caring for the patient. If 8PM-8AM, please contact night-coverage at [email or web address], password [facility]  10/28/2013, 8:38 AM

## 2013-10-28 NOTE — Progress Notes (Signed)
Orders received for swallow evaluation. Unable to complete today. Will f/u 1/4.   Veleka Djordjevic MA, CCC-SLP [phone]   

## 2013-10-29 ENCOUNTER — Encounter (HOSPITAL_COMMUNITY): Payer: Self-pay | Admitting: Internal Medicine

## 2013-10-29 DIAGNOSIS — R7989 Other specified abnormal findings of blood chemistry: Secondary | ICD-10-CM | POA: Diagnosis present

## 2013-10-29 DIAGNOSIS — I272 Pulmonary hypertension, unspecified: Secondary | ICD-10-CM | POA: Diagnosis present

## 2013-10-29 HISTORY — DX: Pulmonary hypertension, unspecified: I27.20

## 2013-10-29 LAB — LEGIONELLA ANTIGEN, URINE: Legionella Antigen, Urine: NEGATIVE

## 2013-10-29 LAB — BASIC METABOLIC PANEL
BUN: 7 mg/dL (ref 6–23)
CHLORIDE: 106 meq/L (ref 96–112)
CO2: 24 meq/L (ref 19–32)
CREATININE: 0.65 mg/dL (ref 0.50–1.10)
Calcium: 9.1 mg/dL (ref 8.4–10.5)
GFR calc Af Amer: 90 mL/min — ABNORMAL LOW (ref >90)
GFR calc non Af Amer: 78 mL/min — ABNORMAL LOW (ref >90)
GLUCOSE: 112 mg/dL — AB (ref 70–99)
POTASSIUM: 4.2 meq/L (ref 3.7–5.3)
Sodium: 139 mEq/L (ref 137–147)

## 2013-10-29 LAB — CBC
HCT: 36.6 % (ref 36.0–46.0)
HEMOGLOBIN: 11.2 g/dL — AB (ref 12.0–15.0)
MCH: 25.7 pg — ABNORMAL LOW (ref 26.0–34.0)
MCHC: 30.6 g/dL (ref 30.0–36.0)
MCV: 84.1 fL (ref 78.0–100.0)
Platelets: 302 10*3/uL (ref 150–400)
RBC: 4.35 MIL/uL (ref 3.87–5.11)
RDW: 15.2 % (ref 11.5–15.5)
WBC: 9.9 10*3/uL (ref 4.0–10.5)

## 2013-10-29 MED ORDER — HALOPERIDOL LACTATE 5 MG/ML IJ SOLN
2.0000 mg | Freq: Once | INTRAMUSCULAR | Status: AC
  Administered 2013-10-29: 2 mg via INTRAVENOUS
  Filled 2013-10-29: qty 1

## 2013-10-29 NOTE — Evaluation (Signed)
Clinical/Bedside Swallow Evaluation Patient Details  Name: [name] Eshelman MRN: [id number] Date of Birth: 16-Apr-1924  Today'Rivas Date: 10/29/2013 Time: [id number] SLP Time Calculation (min): 14 min  Past Medical History:  Past Medical History  Diagnosis Date  . Cardiac dysrhythmia, unspecified   . Unspecified essential hypertension   . Other and unspecified hyperlipidemia   . Atrial fibrillation   . CAD (coronary artery disease)   . Complication of anesthesia     Takes a long time to wake up  . Dementia    Past Surgical History:  Past Surgical History  Procedure Laterality Date  . Coronary artery bypass graft    . Abdominal hysterectomy    . Orthopedic surgery     HPI:  [name] Pinney is an 80 y.o. female with a PMH of atrial fibrillation, coronary artery disease, COPD and dementia who was admitted on 10/28/2012 with increasing shortness of breath and fever.  Initial evaluation with chest radiography in the ED showed a right sided pneumonia.  BSE ordered secondary to concerns with aspiration due to diagnosis of PNA.    Assessment / Plan / Recommendation Clinical Impression  BSE completed.  No outward clinical Rivas/Rivas of aspiration noted throughout evaluation.  Due to lack of dentition would recommend modified diet of dysphagia 3 as caregiver present reports patient tends to chew solids but eventually "spits out " bolus.  No evidence of difficulty with solids during evaluation.   Recommend to continue current diet consistency of regular/thin liquids as caregiver present is cutting up solids in safe amounts and making modified choices from menu.  No further ST warranted as oropharyngeal swallow judged functional with no clinical Rivas/Rivas of aspiration noted with PO'Rivas and patient receiving supervision from caregivers with all meals.  ST to sign off as education complete.      Aspiration Risk  Mild    Diet Recommendation Regular   Liquid Administration via: Cup;Straw Medication Administration:  Crushed with puree Supervision: Patient able to self feed;Full supervision/cueing for compensatory strategies Compensations: Slow rate;Small sips/bites    Other  Recommendations Oral Care Recommendations: Oral care BID   Follow Up Recommendations  None         Swallow Study Prior Functional Status   Lives at home with caregiver    General Date of Onset: 10/27/13 HPI: [name] Dains is an 80 y.o. female with a PMH of atrial fibrillation, coronary artery disease, COPD and dementia who was admitted on 10/28/2012 with increasing shortness of breath and fever.  Initial evaluation with chest radiography in the ED showed a right sided pneumonia. Type of Study: Bedside swallow evaluation Diet Prior to this Study: Regular;Thin liquids Temperature Spikes Noted: No Respiratory Status: Room air History of Recent Intubation: No Behavior/Cognition: Alert;Cooperative;Pleasant mood;Hard of hearing Oral Cavity - Dentition: Missing dentition;Poor condition Self-Feeding Abilities: Able to feed self;Needs set up Patient Positioning: Upright in chair Baseline Vocal Quality: Clear Volitional Cough: Strong Volitional Swallow: Able to elicit    Oral/Motor/Sensory Function Overall Oral Motor/Sensory Function: Appears within functional limits for tasks assessed   Ice Chips Ice chips: Not tested   Thin Liquid Thin Liquid: Within functional limits Presentation: Cup;Straw    Nectar Thick Nectar Thick Liquid: Not tested   Honey Thick Honey Thick Liquid: Not tested   Puree Puree: Within functional limits Presentation: Self Fed   Solid   GO    Solid: Within functional limits Presentation: Self Lorretta HarpFed      Salil Raineri MS, CCC-SLP [phone] [facility] BerlinDANKOF,Ellyse Rotolo 10/29/2013,2:14 PM

## 2013-10-29 NOTE — Progress Notes (Addendum)
[facility] PROGRESS NOTE   [name] WUJ:[id number] DOB: 10-Jun-1924 DOA: 10/27/2013 PCP: Ginette OttoSTONEKING,HAL THOMAS, MD  Brief narrative: [name] PedMary S Rivas is an 80 y.o. female with a PMH of atrial fibrillation, coronary artery disease, COPD and dementia who was admitted on 10/28/2012 with increasing shortness of breath and fever.  Initial evaluation with chest radiography in the ED showed a right sided pneumonia.  Assessment/Plan: Principal Problem:   HCAP (healthcare-associated pneumonia) / dyspnea / leukocytosis Patient was admitted and a serum lactate level was checked and found to be within normal limits. Strep pneumonia antigen and HIV serologies negative. Legionella antigen studies pending. Influenza PCR studies negative. Currently being treated with cefepime and vancomycin. Active Problems:   Atrial fibrillation / Elevated BNP / Pulmonary hypertension Admission EKG showed sinus tachycardia with premature atrial complexes. Not on any rate controlling medications were therapeutic anticoagulation.  BNP elevated.  2 D Echo done 10/28/13 which showed EF 55-60%, mild pulmonary HTN.     COPD, MILD Consider bronchodilators if indicated.   Difficulty swallowing solids Speech therapy evaluation requested.   Dementia Stable.   Vertigo Antivert if needed.   Tooth infection Was on amoxicillin as an outpatient.  Code Status: DNR Family Communication: No family at the bedside.  Daughter, Mauro KaufmannKaren Cates [phone]) updated by phone.   Disposition Plan: From ALF with 24 hour care with aides.   IV access:  Peripheral IV  Medical Consultants:  None  Other Consultants:  Speech therapy  Anti-infectives:  Rocephin 10/27/13---> 10/27/13  Azithromycin 10/27/13---> 10/27/13  Cefepime 10/27/13--->  Vancomycin 10/27/13--->  HPI/Subjective: [name] denies dyspnea.  Occasional cough.  More awake/alert, sitting up in the chair eating lunch.  No complaints of chest pain, nausea, vomiting, diarrhea  or constipation.  Objective: Filed Vitals:   10/28/13 0619 10/28/13 1500 10/28/13 2203 10/29/13 0545  BP: 135/56 128/54 129/48 145/63  Pulse: 72 64 68 68  Temp: 98.3 F (36.8 C) 98.5 F (36.9 C) 98.1 F (36.7 C) 98 F (36.7 C)  TempSrc: Oral Oral Oral Axillary  Resp: 20 16 16 14   Height:      Weight:      SpO2: 92% 100% 96% 91%    Intake/Output Summary (Last 24 hours) at 10/29/13 0820 Last data filed at 10/29/13 [id number]  Gross per 24 hour  Intake   2650 ml  Output    500 ml  Net   2150 ml    Exam: Gen:  NAD Cardiovascular:  RRR, No M/R/G Respiratory:  Lungs diminished Gastrointestinal:  Abdomen softly distended with hyperactive BS, NT. Extremities:  No C/E/C  Data Reviewed: Basic Metabolic Panel:  Recent Labs Lab 10/23/13 0935 10/27/13 1750 10/27/13 2325 10/28/13 0747 10/29/13 0456  NA 141 137  --  137 139  K 3.6 3.8  --  3.6* 4.2  CL 103 97  --  100 106  CO2 29 24  --  29 24  GLUCOSE 118* 128*  --  116* 112*  BUN 8 6  --  8 7  CREATININE 0.71 0.62 0.72 0.76 0.65  CALCIUM 9.5 10.0  --  9.1 9.1  MG  --   --  2.0  --   --   PHOS  --   --  3.4  --   --    GFR Estimated Creatinine Clearance: 44 ml/min (by C-G formula based on Cr of 0.65). Liver Function Tests:  Recent Labs Lab 10/23/13 0935 10/27/13 1750  AST 14 15  ALT 9 9  ALKPHOS 85 81  BILITOT 0.4 0.7  PROT 6.5 6.6  ALBUMIN 3.7 3.6    Recent Labs Lab 10/23/13 0935  LIPASE 27   Coagulation profile  Recent Labs Lab 10/27/13 1750  INR 0.99    CBC:  Recent Labs Lab 10/23/13 0935 10/27/13 1750 10/27/13 2325 10/28/13 0747 10/29/13 0456  WBC 13.8* 24.3* 21.8* 13.4* 9.9  NEUTROABS 11.2* 21.9*  --   --   --   HGB 13.1 13.8 12.2 11.0* 11.2*  HCT 40.7 42.8 38.8 36.1 36.6  MCV 82.7 82.3 82.9 83.4 84.1  PLT 337 365 356 293 302   BNP (last 3 results)  Recent Labs  10/27/13 1750  PROBNP 657.5*   Thyroid function studies  Recent Labs  10/27/13 2325  TSH 3.209    Microbiology Recent Results (from the past 240 hour(s))  MRSA PCR SCREENING     Status: None   Collection Time    10/27/13 11:11 PM      Result Value Range Status   MRSA by PCR NEGATIVE  NEGATIVE Final   Comment:            The GeneXpert MRSA Assay (FDA     approved for NASAL specimens     only), is one component of a     comprehensive MRSA colonization     surveillance program. It is not     intended to diagnose MRSA     infection nor to guide or     monitor treatment for     MRSA infections.     Procedures and Diagnostic Studies: Dg Chest 2 View  10/27/2013   CLINICAL DATA:  Atrial fibrillation.  Shortness of breath.  EXAM: CHEST  2 VIEW  COMPARISON:  Chest x-ray 10/23/2013 and 12/13/2008.  FINDINGS: Previously identified regions of discoid atelectasis and/or infiltrates and have largely cleared with minimal residual. A questionable density is present in the upper aspect of the right upper lobe. Follow-up chest x-ray recommended demonstrate complete clearing of the mild residual basilar atelectatic changes and of the a questionable infiltrate or focal lesion in the right upper lobe. Heart size and pulmonary vascularity normal. No pleural effusion or pneumothorax. No acute bony abnormality.  IMPRESSION: 1. Interval near-complete clearing of basilar atelectasis and/or infiltrates. There is a questionable infiltrate versus focal pulmonary lesion in the right upper lobe. Continued follow-up chest x-ray is recommended to demonstrate clearing of the right upper lobe and minimal residual basilar atelectasis/infiltrates .  2. Prior CABG.  No evidence of CHF.   Electronically Signed   By: Maisie Fus  Register   On: 10/27/2013 17:45   Ct Head Wo Contrast  10/27/2013   CLINICAL DATA:  Altered mental status.  Confusion.  EXAM: CT HEAD WITHOUT CONTRAST  TECHNIQUE: Contiguous axial images were obtained from the base of the skull through the vertex without intravenous contrast.  COMPARISON:  11/10/2011   FINDINGS: Despite efforts by the technologist and patient, motion artifact is present on today's exam and could not be eliminated. This reduces exam sensitivity and specificity. The brainstem, cerebellum, cerebral peduncles, thalamus, basal ganglia, basilar cisterns, and ventricular system appear within normal limits. Periventricular white matter and corona radiata hypodensities favor chronic ischemic microvascular white matter disease. No intracranial hemorrhage, mass lesion, or acute CVA. Visualized paranasal sinuses appear clear.  IMPRESSION: 1. No acute intracranial findings. 2. Periventricular white matter and corona radiata hypodensities favor chronic ischemic microvascular white matter disease.   Electronically Signed   By: Herbie Baltimore M.D.  On: 10/27/2013 17:53   Ct Abdomen Pelvis W Contrast  10/23/2013   CLINICAL DATA:  Nausea and vomiting.  EXAM: CT ABDOMEN AND PELVIS WITH CONTRAST  TECHNIQUE: Multidetector CT imaging of the abdomen and pelvis was performed using the standard protocol following bolus administration of intravenous contrast.  CONTRAST:  80mL OMNIPAQUE IOHEXOL 300 MG/ML  SOLN  COMPARISON:  Prior radiograph performed earlier on the same day  FINDINGS: Bibasilar atelectasis and/ scarring is present. No focal infiltrate seen within the visualized lung bases. Cardiomegaly with sequelae of prior bypass is noted. No pericardial effusion. Diffuse 3 vessel coronary artery calcifications are partially visualized. Median sternotomy noted.  Liver demonstrates a normal contrast enhanced appearance. No focal intrahepatic lesions. Calcified gallstones noted within the gallbladder without evidence of acute cholecystitis. No biliary ductal dilatation the spleen is within normal limits. Adrenal glands and pancreas demonstrate a normal contrast enhanced appearance.  Kidneys are symmetric in size with symmetric enhancement. No nephrolithiasis, hydronephrosis, or focal enhancing renal mass identified.   Small hiatal hernia is noted. There is no evidence of bowel obstruction. Stomach is within normal limits. No abnormal wall thickening or inflammatory fat stranding seen about the small or large bowel. Scattered colonic diverticula are noted without acute diverticulitis. The appendix is not definitely visualize, however, no inflammatory changes are seen within the right lower quadrant or about the cecum to suggest acute appendicitis. Small fat containing periumbilical hernia is noted.  Mild diffuse this the mesenteric stranding noted within the mid abdomen. There is an enlarged 1.8 cm right periaortic lymph node (series 2, image 37). Additional shotty periaortic adenopathy measuring up to 1.1 cm is noted. This finding is of uncertain etiology, and may be reactive.  Bladder is within normal limits. Uterus and ovaries are not visualized.  No free air or fluid identified.  Heavy aorto bi-iliac atherosclerotic calcifications noted.  Multilevel degenerative changes noted within the visualized thoracolumbar spine. No focal lytic or blastic osseous lesions identified.  IMPRESSION: 1. Periaortic adenopathy measuring up to 1.9 cm in short axis with misty mesenteric fat stranding. Finding is of uncertain etiology, but may reflect acute mesenteric adenitis. Clinical correlation is recommended. A short interval followup scan could be considered to ensure resolution as clinically indicated. 2. Colonic diverticulosis. No CT evidence of acute diverticulitis or colitis identified. 3. Cholelithiasis without CT evidence of acute cholecystitis. 4. Small hiatal hernia.   Electronically Signed   By: Rise Mu M.D.   On: 10/23/2013 14:11   Dg Abd Acute W/chest  10/23/2013   CLINICAL DATA:  Nausea, abdominal pain  EXAM: ACUTE ABDOMEN SERIES (ABDOMEN 2 VIEW & CHEST 1 VIEW)  COMPARISON:  12/13/2008  FINDINGS: Low lung volumes. Prominence of interstitial markings. Nodular areas of increased density projects within the right  perihilar region and medial aspect of the right lung apex. The cardiac silhouette is enlarged. Areas of discoid atelectasis project within the right left lung bases. Atherosclerotic calcifications are appreciated within the aorta. Patient is status post median sternotomy and coronary artery bypass grafting.  Air is seen within nondilated loops of large small bowel. A moderate to large amount of stool projects within the colon. Rounded calcified densities project within the right quadrant may represent calcified gallstones.  IMPRESSION: Low lung volumes. The described findings in the lungs may be secondary to the shallow lung volumes. Alternatively focal nodular infiltrates versus nodules or masses in the right hilar and right apical regions cannot be excluded. Further evaluation dedicated PA and lateral views of chest  is recommended. Nonobstructive bowel gas pattern with a moderate amount of stool.   Electronically Signed   By: Salome Holmes M.D.   On: 10/23/2013 10:30    Scheduled Meds: . ceFEPime (MAXIPIME) IV  1 g Intravenous Q24H  . enoxaparin (LOVENOX) injection  40 mg Subcutaneous QHS  . vancomycin  500 mg Intravenous Q12H   Continuous Infusions: . sodium chloride 1,000 mL (10/27/13 1830)  . 0.9 % NaCl with KCl 20 mEq / L 75 mL/hr at 10/29/13 0512    Time spent: 25 minutes.   LOS: 2 days   Xolani Degracia  [facility] Pager [phone].   *Please note that the hospitalists switch teams on Wednesdays. Please call the flow manager at [phone] if you are having difficulty reaching the hospitalist taking care of this patient as she can update you and provide the most up-to-date pager number of provider caring for the patient. If 8PM-8AM, please contact night-coverage at [email or web address], password [facility]  10/29/2013, 8:20 AM

## 2013-10-30 DIAGNOSIS — F0390 Unspecified dementia without behavioral disturbance: Secondary | ICD-10-CM

## 2013-10-30 DIAGNOSIS — K047 Periapical abscess without sinus: Secondary | ICD-10-CM

## 2013-10-30 LAB — VANCOMYCIN, TROUGH: Vancomycin Tr: 11.5 ug/mL (ref 10.0–20.0)

## 2013-10-30 MED ORDER — AMOXICILLIN-POT CLAVULANATE 875-125 MG PO TABS
1.0000 | ORAL_TABLET | Freq: Two times a day (BID) | ORAL | Status: DC
Start: 2013-10-30 — End: 2013-10-30

## 2013-10-30 MED ORDER — AMOXICILLIN 875 MG PO TABS: 875.0000 mg | ORAL_TABLET | Freq: Two times a day (BID) | ORAL | Status: DC

## 2013-10-30 MED ORDER — AMOXICILLIN-POT CLAVULANATE 875-125 MG PO TABS: 1.0000 | ORAL_TABLET | Freq: Two times a day (BID) | ORAL | Status: DC

## 2013-10-30 NOTE — Discharge Instructions (Signed)

## 2013-10-30 NOTE — Progress Notes (Signed)
Patient is set to discharge back to [facility] today. Patient, caregiver & daughter, Judeth CornfieldStephanie aware. [place]. RN, Amil AmenJulia aware. CSW confirmed with Dottie @ [facility] that patient is ok to return. Daughter, Judeth CornfieldStephanie on her way to transport back to ALF.   Unice BaileyKelly Foley, LCSW [facility] Clinical Social Worker cell #: [phone]

## 2013-10-30 NOTE — Discharge Summary (Addendum)
Physician Discharge Summary  [name] ZOX:[id number] DOB: [birth year] DOA: 10/27/2013  PCP: Ginette Otto, MD  Admit date: 10/27/2013 Discharge date: 10/30/2013  Recommendations for Outpatient Follow-up:  1. F/U with a dentist this week for re-evaluation of abscessed tooth. 2. F/U with PCP in 1 week. 3. F/U final blood culture results.  Discharge Diagnoses:  Principal Problem:    HCAP (healthcare-associated pneumonia) Active Problems:    Atrial fibrillation    COPD, MILD    DYSPNEA    Leukocytosis    Difficulty swallowing solids    Dementia    Vertigo    Abscessed tooth    Elevated brain natriuretic peptide (BNP) level    Pulmonary HTN   Discharge Condition: Improved.  Diet recommendation: Regular.  History of present illness:  [name] is an 80 y.o. female with a PMH of atrial fibrillation, coronary artery disease, COPD and dementia who was admitted on 10/28/2012 with increasing shortness of breath and fever. Initial evaluation with chest radiography in the ED showed a right sided pneumonia.  Hospital Course by problem:  Principal Problem:  HCAP (healthcare-associated pneumonia) / dyspnea / leukocytosis  Patient was admitted and a serum lactate level was checked and found to be within normal limits. Strep pneumonia antigen and HIV serologies negative. Legionella antigen studies negative. Influenza PCR studies negative. Currently being treated with cefepime and vancomycin. Narrow antibiotics to Augmentin for an additional 5 days of therapy at discharge. Active Problems:  Atrial fibrillation / Elevated BNP / Pulmonary hypertension  Admission EKG showed sinus tachycardia with premature atrial complexes. Not on any rate controlling medications were therapeutic anticoagulation. BNP elevated. 2 D Echo done 10/28/13 which showed EF 55-60%, mild pulmonary HTN.  Lung exam stable on discharge. COPD, MILD  Stable.  Difficulty swallowing solids  Seen by Speech  therapist 10/29/13.   Regular diet/thin liquids OK. Dementia  Stable.  Vertigo  Antivert if needed.  Tooth infection  Was on amoxicillin as an outpatient.  D/C on Augmentin.   Discharge Exam: Filed Vitals:   10/30/13 0500  BP:   Pulse: 73  Temp: 98.3 F (36.8 C)  Resp: 16   Filed Vitals:   10/29/13 1523 10/29/13 1908 10/29/13 2325 10/30/13 0500  BP: 148/50  170/67   Pulse: 71  75 73  Temp: 98.1 F (36.7 C)  98.1 F (36.7 C) 98.3 F (36.8 C)  TempSrc: Oral  Oral Oral  Resp: 16  16 16   Height:      Weight:  68.2 kg (150 lb 5.7 oz)  66.3 kg (146 lb 2.6 oz)  SpO2: 94%  95% 96%    Gen:  NAD Cardiovascular:  RRR, No M/R/G Respiratory: Lungs clear with a few basilar rales on left Gastrointestinal: Abdomen soft, NT/ND with normal active bowel sounds. Extremities: No C/E/C   Discharge Instructions      Discharge Orders   Future Orders Complete By Expires   Diet general  As directed    Discharge instructions  As directed    Comments:     Stop the Amoxicillin and take Augmentin instead.  F/U with a dentist this week.  You were cared for by Dr. Hillery Aldo  (a hospitalist) during your hospital stay. If you have any questions about your discharge medications or the care you received while you were in the hospital after you are discharged, you can call the unit and ask to speak with the hospitalist on call if the hospitalist that took care of you is  not available. Once you are discharged, your primary care physician will handle any further medical issues. Please note that NO REFILLS for any discharge medications will be authorized once you are discharged, as it is imperative that you return to your primary care physician (or establish a relationship with a primary care physician if you do not have one) for your aftercare needs so that they can reassess your need for medications and monitor your lab values.  Any outstanding tests can be reviewed by your PCP at your follow up  visit.  It is also important to review any medicine changes with your PCP.  Please bring these d/c instructions with you to your next visit so your physician can review these changes with you.  If you do not have a primary care physician, you can call [phone] for a physician referral.  It is highly recommended that you obtain a PCP for hospital follow up.   Face-to-face encounter (required for Medicare/Medicaid patients)  As directed    Comments:     I RAMA,CHRISTINA certify that this patient is under my care and that I, or a nurse practitioner or physician's assistant working with me, had a face-to-face encounter that meets the physician face-to-face encounter requirements with this patient on 10/30/2013. The encounter with the patient was in whole, or in part for the following medical condition(s) which is the primary reason for home health care (List medical condition): Deconditioning post PNA.   Questions:     The encounter with the patient was in whole, or in part, for the following medical condition, which is the primary reason for home health care:  PNA   I certify that, based on my findings, the following services are medically necessary home health services:  Physical therapy   My clinical findings support the need for the above services:  Unable to leave home safely without assistance and/or assistive device   Further, I certify that my clinical findings support that this patient is homebound due to:  Unable to leave home safely without assistance   Reason for Medically Necessary Home Health Services:  Therapy- Therapeutic Exercises to Increase Strength and Endurance   Home Health  As directed    Questions:     To provide the following care/treatments:  PT   Increase activity slowly  As directed    Walk with assistance  As directed    Walker   As directed        Medication List    STOP taking these medications       amoxicillin 875 MG tablet  Commonly known as:  AMOXIL      TAKE  these medications       amoxicillin-clavulanate 875-125 MG per tablet  Commonly known as:  AUGMENTIN  Take 1 tablet by mouth 2 (two) times daily. Take for 5 more days.     meclizine 25 MG tablet  Commonly known as:  ANTIVERT  Take 1 tablet (25 mg total) by mouth 4 (four) times daily.     ondansetron 4 MG disintegrating tablet  Commonly known as:  ZOFRAN ODT  Take 1 tablet (4 mg total) by mouth every 8 (eight) hours as needed for nausea or vomiting.     triamcinolone cream 0.1 %  Commonly known as:  KENALOG  Apply 1 application topically daily as needed (to fave for eczema).       Follow-up Information   Follow up with Ginette OttoSTONEKING,HAL THOMAS, MD. Schedule an appointment as soon as possible  for a visit in 1 week. [facility] follow up.)    Specialty:  Internal Medicine   Contact information:   [address] [place] [address] [phone]        The results of significant diagnostics from this hospitalization (including imaging, microbiology, ancillary and laboratory) are listed below for reference.    Significant Diagnostic Studies: Dg Chest 2 View  10/27/2013   CLINICAL DATA:  Atrial fibrillation.  Shortness of breath.  EXAM: CHEST  2 VIEW  COMPARISON:  Chest x-ray 10/23/2013 and 12/13/2008.  FINDINGS: Previously identified regions of discoid atelectasis and/or infiltrates and have largely cleared with minimal residual. A questionable density is present in the upper aspect of the right upper lobe. Follow-up chest x-ray recommended demonstrate complete clearing of the mild residual basilar atelectatic changes and of the a questionable infiltrate or focal lesion in the right upper lobe. Heart size and pulmonary vascularity normal. No pleural effusion or pneumothorax. No acute bony abnormality.  IMPRESSION: 1. Interval near-complete clearing of basilar atelectasis and/or infiltrates. There is a questionable infiltrate versus focal pulmonary lesion in the right upper lobe.  Continued follow-up chest x-ray is recommended to demonstrate clearing of the right upper lobe and minimal residual basilar atelectasis/infiltrates .  2. Prior CABG.  No evidence of CHF.   Electronically Signed   By: Maisie Fus  Register   On: 10/27/2013 17:45   Ct Head Wo Contrast  10/27/2013   CLINICAL DATA:  Altered mental status.  Confusion.  EXAM: CT HEAD WITHOUT CONTRAST  TECHNIQUE: Contiguous axial images were obtained from the base of the skull through the vertex without intravenous contrast.  COMPARISON:  11/10/2011  FINDINGS: Despite efforts by the technologist and patient, motion artifact is present on today's exam and could not be eliminated. This reduces exam sensitivity and specificity. The brainstem, cerebellum, cerebral peduncles, thalamus, basal ganglia, basilar cisterns, and ventricular system appear within normal limits. Periventricular white matter and corona radiata hypodensities favor chronic ischemic microvascular white matter disease. No intracranial hemorrhage, mass lesion, or acute CVA. Visualized paranasal sinuses appear clear.  IMPRESSION: 1. No acute intracranial findings. 2. Periventricular white matter and corona radiata hypodensities favor chronic ischemic microvascular white matter disease.   Electronically Signed   By: Herbie Baltimore M.D.   On: 10/27/2013 17:53   Ct Abdomen Pelvis W Contrast  10/23/2013   CLINICAL DATA:  Nausea and vomiting.  EXAM: CT ABDOMEN AND PELVIS WITH CONTRAST  TECHNIQUE: Multidetector CT imaging of the abdomen and pelvis was performed using the standard protocol following bolus administration of intravenous contrast.  CONTRAST:  80mL OMNIPAQUE IOHEXOL 300 MG/ML  SOLN  COMPARISON:  Prior radiograph performed earlier on the same day  FINDINGS: Bibasilar atelectasis and/ scarring is present. No focal infiltrate seen within the visualized lung bases. Cardiomegaly with sequelae of prior bypass is noted. No pericardial effusion. Diffuse 3 vessel coronary  artery calcifications are partially visualized. Median sternotomy noted.  Liver demonstrates a normal contrast enhanced appearance. No focal intrahepatic lesions. Calcified gallstones noted within the gallbladder without evidence of acute cholecystitis. No biliary ductal dilatation the spleen is within normal limits. Adrenal glands and pancreas demonstrate a normal contrast enhanced appearance.  Kidneys are symmetric in size with symmetric enhancement. No nephrolithiasis, hydronephrosis, or focal enhancing renal mass identified.  Small hiatal hernia is noted. There is no evidence of bowel obstruction. Stomach is within normal limits. No abnormal wall thickening or inflammatory fat stranding seen about the small or large bowel. Scattered colonic  diverticula are noted without acute diverticulitis. The appendix is not definitely visualize, however, no inflammatory changes are seen within the right lower quadrant or about the cecum to suggest acute appendicitis. Small fat containing periumbilical hernia is noted.  Mild diffuse this the mesenteric stranding noted within the mid abdomen. There is an enlarged 1.8 cm right periaortic lymph node (series 2, image 37). Additional shotty periaortic adenopathy measuring up to 1.1 cm is noted. This finding is of uncertain etiology, and may be reactive.  Bladder is within normal limits. Uterus and ovaries are not visualized.  No free air or fluid identified.  Heavy aorto bi-iliac atherosclerotic calcifications noted.  Multilevel degenerative changes noted within the visualized thoracolumbar spine. No focal lytic or blastic osseous lesions identified.  IMPRESSION: 1. Periaortic adenopathy measuring up to 1.9 cm in short axis with misty mesenteric fat stranding. Finding is of uncertain etiology, but may reflect acute mesenteric adenitis. Clinical correlation is recommended. A short interval followup scan could be considered to ensure resolution as clinically indicated. 2. Colonic  diverticulosis. No CT evidence of acute diverticulitis or colitis identified. 3. Cholelithiasis without CT evidence of acute cholecystitis. 4. Small hiatal hernia.   Electronically Signed   By: Rise Mu M.D.   On: 10/23/2013 14:11   Dg Abd Acute W/chest  10/23/2013   CLINICAL DATA:  Nausea, abdominal pain  EXAM: ACUTE ABDOMEN SERIES (ABDOMEN 2 VIEW & CHEST 1 VIEW)  COMPARISON:  12/13/2008  FINDINGS: Low lung volumes. Prominence of interstitial markings. Nodular areas of increased density projects within the right perihilar region and medial aspect of the right lung apex. The cardiac silhouette is enlarged. Areas of discoid atelectasis project within the right left lung bases. Atherosclerotic calcifications are appreciated within the aorta. Patient is status post median sternotomy and coronary artery bypass grafting.  Air is seen within nondilated loops of large small bowel. A moderate to large amount of stool projects within the colon. Rounded calcified densities project within the right quadrant may represent calcified gallstones.  IMPRESSION: Low lung volumes. The described findings in the lungs may be secondary to the shallow lung volumes. Alternatively focal nodular infiltrates versus nodules or masses in the right hilar and right apical regions cannot be excluded. Further evaluation dedicated PA and lateral views of chest is recommended. Nonobstructive bowel gas pattern with a moderate amount of stool.   Electronically Signed   By: Salome Holmes M.D.   On: 10/23/2013 10:30    Labs:  Basic Metabolic Panel:  Recent Labs Lab 10/27/13 1750 10/27/13 2325 10/28/13 0747 10/29/13 0456  NA 137  --  137 139  K 3.8  --  3.6* 4.2  CL 97  --  100 106  CO2 24  --  29 24  GLUCOSE 128*  --  116* 112*  BUN 6  --  8 7  CREATININE 0.62 0.72 0.76 0.65  CALCIUM 10.0  --  9.1 9.1  MG  --  2.0  --   --   PHOS  --  3.4  --   --    GFR Estimated Creatinine Clearance: 44.3 ml/min (by C-G formula  based on Cr of 0.65). Liver Function Tests:  Recent Labs Lab 10/27/13 1750  AST 15  ALT 9  ALKPHOS 81  BILITOT 0.7  PROT 6.6  ALBUMIN 3.6   Coagulation profile  Recent Labs Lab 10/27/13 1750  INR 0.99    CBC:  Recent Labs Lab 10/27/13 1750 10/27/13 2325 10/28/13 0747 10/29/13 0456  WBC 24.3* 21.8* 13.4* 9.9  NEUTROABS 21.9*  --   --   --   HGB 13.8 12.2 11.0* 11.2*  HCT 42.8 38.8 36.1 36.6  MCV 82.3 82.9 83.4 84.1  PLT 365 356 293 302   Thyroid function studies  Recent Labs  10/27/13 2325  TSH 3.209   Microbiology Recent Results (from the past 240 hour(s))  CULTURE, BLOOD (ROUTINE X 2)     Status: None   Collection Time    10/27/13 10:48 PM      Result Value Range Status   Specimen Description BLOOD LEFT ARM   Final   Special Requests BOTTLES DRAWN AEROBIC AND ANAEROBIC 5CC   Final   Culture  Setup Time     Final   Value: 10/28/2013 05:58     Performed at [facility]   Culture     Final   Value:        BLOOD CULTURE RECEIVED NO GROWTH TO DATE CULTURE WILL BE HELD FOR 5 DAYS BEFORE ISSUING A FINAL NEGATIVE REPORT     Performed at [facility]   Report Status PENDING   Incomplete  MRSA PCR SCREENING     Status: None   Collection Time    10/27/13 11:11 PM      Result Value Range Status   MRSA by PCR NEGATIVE  NEGATIVE Final   Comment:            The GeneXpert MRSA Assay (FDA     approved for NASAL specimens     only), is one component of a     comprehensive MRSA colonization     surveillance program. It is not     intended to diagnose MRSA     infection nor to guide or     monitor treatment for     MRSA infections.  CULTURE, BLOOD (ROUTINE X 2)     Status: None   Collection Time    10/27/13 11:25 PM      Result Value Range Status   Specimen Description BLOOD RIGHT ARM   Final   Special Requests BOTTLES DRAWN AEROBIC AND ANAEROBIC 5CC   Final   Culture  Setup Time     Final   Value: 10/28/2013 04:56     Performed at [facility]   Culture     Final   Value:        BLOOD CULTURE RECEIVED NO GROWTH TO DATE CULTURE WILL BE HELD FOR 5 DAYS BEFORE ISSUING A FINAL NEGATIVE REPORT     Performed at [facility]   Report Status PENDING   Incomplete    Time coordinating discharge: 35 minutes.  Signed:  RAMA,CHRISTINA  Pager [phone] [facility] 10/30/2013, 3:04 PM

## 2013-10-31 NOTE — Progress Notes (Signed)
Pt dc to ALF who will arrange for HHPT.

## 2013-11-03 LAB — CULTURE, BLOOD (ROUTINE X 2)
CULTURE: NO GROWTH
Culture: NO GROWTH

## 2013-11-17 ENCOUNTER — Ambulatory Visit
Admission: RE | Admit: 2013-11-17 | Discharge: 2013-11-17 | Disposition: A | Discharge status: Home / Self Care | Payer: Medicare HMO | Source: Ambulatory Visit | Attending: Geriatric Medicine | Admitting: Geriatric Medicine

## 2013-11-17 ENCOUNTER — Other Ambulatory Visit: Payer: Self-pay | Admitting: Geriatric Medicine

## 2013-11-17 DIAGNOSIS — J159 Unspecified bacterial pneumonia: Secondary | ICD-10-CM

## 2013-11-20 ENCOUNTER — Other Ambulatory Visit: Payer: Self-pay | Admitting: Geriatric Medicine

## 2013-11-20 DIAGNOSIS — R9389 Abnormal findings on diagnostic imaging of other specified body structures: Secondary | ICD-10-CM

## 2013-11-23 ENCOUNTER — Ambulatory Visit
Admission: RE | Admit: 2013-11-23 | Discharge: 2013-11-23 | Disposition: A | Discharge status: Home / Self Care | Payer: Managed Care, Other (non HMO) | Source: Ambulatory Visit | Attending: Geriatric Medicine | Admitting: Geriatric Medicine

## 2013-11-23 ENCOUNTER — Other Ambulatory Visit: Payer: Medicare Other

## 2013-11-23 DIAGNOSIS — R9389 Abnormal findings on diagnostic imaging of other specified body structures: Secondary | ICD-10-CM

## 2013-11-29 ENCOUNTER — Emergency Department (HOSPITAL_COMMUNITY): Payer: Medicare HMO

## 2013-11-29 ENCOUNTER — Inpatient Hospital Stay (HOSPITAL_COMMUNITY)
Admission: EM | Admit: 2013-11-29 | Discharge: 2013-12-04 | DRG: 291 | Disposition: A | Discharge status: Hospice | Payer: Medicare HMO | Attending: Internal Medicine | Admitting: Internal Medicine

## 2013-11-29 ENCOUNTER — Encounter (HOSPITAL_COMMUNITY): Payer: Self-pay | Admitting: Emergency Medicine

## 2013-11-29 DIAGNOSIS — Z9119 Patient's noncompliance with other medical treatment and regimen: Secondary | ICD-10-CM

## 2013-11-29 DIAGNOSIS — R1310 Dysphagia, unspecified: Secondary | ICD-10-CM

## 2013-11-29 DIAGNOSIS — E785 Hyperlipidemia, unspecified: Secondary | ICD-10-CM

## 2013-11-29 DIAGNOSIS — F0390 Unspecified dementia without behavioral disturbance: Secondary | ICD-10-CM

## 2013-11-29 DIAGNOSIS — I509 Heart failure, unspecified: Secondary | ICD-10-CM

## 2013-11-29 DIAGNOSIS — J4489 Other specified chronic obstructive pulmonary disease: Secondary | ICD-10-CM

## 2013-11-29 DIAGNOSIS — Z515 Encounter for palliative care: Secondary | ICD-10-CM

## 2013-11-29 DIAGNOSIS — K5790 Diverticulosis of intestine, part unspecified, without perforation or abscess without bleeding: Secondary | ICD-10-CM

## 2013-11-29 DIAGNOSIS — Z66 Do not resuscitate: Secondary | ICD-10-CM | POA: Diagnosis present

## 2013-11-29 DIAGNOSIS — I2510 Atherosclerotic heart disease of native coronary artery without angina pectoris: Secondary | ICD-10-CM

## 2013-11-29 DIAGNOSIS — I2789 Other specified pulmonary heart diseases: Secondary | ICD-10-CM | POA: Diagnosis present

## 2013-11-29 DIAGNOSIS — I4891 Unspecified atrial fibrillation: Secondary | ICD-10-CM

## 2013-11-29 DIAGNOSIS — D72829 Elevated white blood cell count, unspecified: Secondary | ICD-10-CM

## 2013-11-29 DIAGNOSIS — E440 Moderate protein-calorie malnutrition: Secondary | ICD-10-CM

## 2013-11-29 DIAGNOSIS — R7989 Other specified abnormal findings of blood chemistry: Secondary | ICD-10-CM

## 2013-11-29 DIAGNOSIS — E43 Unspecified severe protein-calorie malnutrition: Secondary | ICD-10-CM

## 2013-11-29 DIAGNOSIS — R0602 Shortness of breath: Secondary | ICD-10-CM

## 2013-11-29 DIAGNOSIS — Z91199 Patient's noncompliance with other medical treatment and regimen due to unspecified reason: Secondary | ICD-10-CM

## 2013-11-29 DIAGNOSIS — Z951 Presence of aortocoronary bypass graft: Secondary | ICD-10-CM

## 2013-11-29 DIAGNOSIS — I5032 Chronic diastolic (congestive) heart failure: Secondary | ICD-10-CM

## 2013-11-29 DIAGNOSIS — K047 Periapical abscess without sinus: Secondary | ICD-10-CM

## 2013-11-29 DIAGNOSIS — F411 Generalized anxiety disorder: Secondary | ICD-10-CM | POA: Diagnosis present

## 2013-11-29 DIAGNOSIS — K8020 Calculus of gallbladder without cholecystitis without obstruction: Secondary | ICD-10-CM

## 2013-11-29 DIAGNOSIS — R42 Dizziness and giddiness: Secondary | ICD-10-CM

## 2013-11-29 DIAGNOSIS — J189 Pneumonia, unspecified organism: Secondary | ICD-10-CM

## 2013-11-29 DIAGNOSIS — I272 Pulmonary hypertension, unspecified: Secondary | ICD-10-CM

## 2013-11-29 DIAGNOSIS — Z9181 History of falling: Secondary | ICD-10-CM

## 2013-11-29 DIAGNOSIS — J449 Chronic obstructive pulmonary disease, unspecified: Secondary | ICD-10-CM

## 2013-11-29 DIAGNOSIS — I5033 Acute on chronic diastolic (congestive) heart failure: Secondary | ICD-10-CM

## 2013-11-29 DIAGNOSIS — R627 Adult failure to thrive: Secondary | ICD-10-CM | POA: Diagnosis present

## 2013-11-29 DIAGNOSIS — Z8249 Family history of ischemic heart disease and other diseases of the circulatory system: Secondary | ICD-10-CM

## 2013-11-29 DIAGNOSIS — I959 Hypotension, unspecified: Secondary | ICD-10-CM | POA: Diagnosis not present

## 2013-11-29 DIAGNOSIS — I10 Essential (primary) hypertension: Secondary | ICD-10-CM

## 2013-11-29 LAB — CBC WITH DIFFERENTIAL/PLATELET
BASOS PCT: 0 % (ref 0–1)
Basophils Absolute: 0 10*3/uL (ref 0.0–0.1)
EOS PCT: 1 % (ref 0–5)
Eosinophils Absolute: 0.2 10*3/uL (ref 0.0–0.7)
HEMATOCRIT: 35.1 % — AB (ref 36.0–46.0)
Hemoglobin: 11 g/dL — ABNORMAL LOW (ref 12.0–15.0)
Lymphocytes Relative: 13 % (ref 12–46)
Lymphs Abs: 2.2 10*3/uL (ref 0.7–4.0)
MCH: 25.1 pg — ABNORMAL LOW (ref 26.0–34.0)
MCHC: 31.3 g/dL (ref 30.0–36.0)
MCV: 80 fL (ref 78.0–100.0)
MONO ABS: 1.5 10*3/uL — AB (ref 0.1–1.0)
Monocytes Relative: 8 % (ref 3–12)
Neutro Abs: 13.8 10*3/uL — ABNORMAL HIGH (ref 1.7–7.7)
Neutrophils Relative %: 78 % — ABNORMAL HIGH (ref 43–77)
Platelets: 349 10*3/uL (ref 150–400)
RBC: 4.39 MIL/uL (ref 3.87–5.11)
RDW: 14.9 % (ref 11.5–15.5)
WBC: 17.7 10*3/uL — AB (ref 4.0–10.5)

## 2013-11-29 LAB — URINALYSIS, ROUTINE W REFLEX MICROSCOPIC
BILIRUBIN URINE: NEGATIVE
Glucose, UA: NEGATIVE mg/dL
Hgb urine dipstick: NEGATIVE
KETONES UR: NEGATIVE mg/dL
LEUKOCYTES UA: NEGATIVE
NITRITE: NEGATIVE
PROTEIN: NEGATIVE mg/dL
Specific Gravity, Urine: 1.013 (ref 1.005–1.030)
UROBILINOGEN UA: 0.2 mg/dL (ref 0.0–1.0)
pH: 5.5 (ref 5.0–8.0)

## 2013-11-29 LAB — COMPREHENSIVE METABOLIC PANEL
ALK PHOS: 65 U/L (ref 39–117)
ALT: 38 U/L — AB (ref 0–35)
AST: 33 U/L (ref 0–37)
Albumin: 2.8 g/dL — ABNORMAL LOW (ref 3.5–5.2)
BILIRUBIN TOTAL: 0.5 mg/dL (ref 0.3–1.2)
BUN: 17 mg/dL (ref 6–23)
CHLORIDE: 109 meq/L (ref 96–112)
CO2: 21 mEq/L (ref 19–32)
Calcium: 7.6 mg/dL — ABNORMAL LOW (ref 8.4–10.5)
Creatinine, Ser: 0.74 mg/dL (ref 0.50–1.10)
GFR calc non Af Amer: 74 mL/min — ABNORMAL LOW (ref >90)
GFR, EST AFRICAN AMERICAN: 86 mL/min — AB (ref >90)
GLUCOSE: 110 mg/dL — AB (ref 70–99)
POTASSIUM: 4.3 meq/L (ref 3.7–5.3)
SODIUM: 143 meq/L (ref 137–147)
TOTAL PROTEIN: 5.1 g/dL — AB (ref 6.0–8.3)

## 2013-11-29 LAB — TROPONIN I: Troponin I: <0.3 ng/mL (ref <0.30)

## 2013-11-29 LAB — MRSA PCR SCREENING: MRSA by PCR: NEGATIVE

## 2013-11-29 LAB — PRO B NATRIURETIC PEPTIDE: Pro B Natriuretic peptide (BNP): 2016 pg/mL — ABNORMAL HIGH (ref 0–450)

## 2013-11-29 MED ORDER — SODIUM CHLORIDE 0.9 % IV BOLUS (SEPSIS)
250.0000 mL | Freq: Once | INTRAVENOUS | Status: AC
  Administered 2013-11-29: 250 mL via INTRAVENOUS

## 2013-11-29 MED ORDER — FUROSEMIDE 10 MG/ML IJ SOLN
40.0000 mg | Freq: Every day | INTRAMUSCULAR | Status: DC
  Administered 2013-11-30: 40 mg via INTRAVENOUS
  Filled 2013-11-29: qty 4

## 2013-11-29 MED ORDER — LORAZEPAM 2 MG/ML IJ SOLN
1.0000 mg | Freq: Once | INTRAMUSCULAR | Status: AC
  Administered 2013-11-29: 1 mg via INTRAVENOUS
  Filled 2013-11-29: qty 1

## 2013-11-29 MED ORDER — SODIUM CHLORIDE 0.9 % IV SOLN: INTRAVENOUS | Status: AC

## 2013-11-29 MED ORDER — ONDANSETRON HCL 4 MG PO TABS: 4.0000 mg | ORAL_TABLET | Freq: Four times a day (QID) | ORAL | Status: DC | PRN

## 2013-11-29 MED ORDER — LORAZEPAM 1 MG PO TABS
1.0000 mg | ORAL_TABLET | Freq: Three times a day (TID) | ORAL | Status: DC | PRN
  Administered 2013-11-29: 1 mg via ORAL
  Filled 2013-11-29: qty 1

## 2013-11-29 MED ORDER — MECLIZINE HCL 25 MG PO TABS
25.0000 mg | ORAL_TABLET | Freq: Four times a day (QID) | ORAL | Status: DC | PRN
  Administered 2013-11-30: 25 mg via ORAL
  Filled 2013-11-29 (×2): qty 1

## 2013-11-29 MED ORDER — METOPROLOL TARTRATE 50 MG PO TABS
50.0000 mg | ORAL_TABLET | Freq: Two times a day (BID) | ORAL | Status: DC
  Administered 2013-11-29: 50 mg via ORAL
  Filled 2013-11-29 (×3): qty 1

## 2013-11-29 MED ORDER — DILTIAZEM HCL 100 MG IV SOLR
5.0000 mg/h | Freq: Once | INTRAVENOUS | Status: AC
  Administered 2013-11-29: 5 mg/h via INTRAVENOUS

## 2013-11-29 MED ORDER — ONDANSETRON HCL 4 MG/2ML IJ SOLN
4.0000 mg | Freq: Four times a day (QID) | INTRAMUSCULAR | Status: DC | PRN
Start: 2013-11-29 — End: 2013-12-04

## 2013-11-29 MED ORDER — ENOXAPARIN SODIUM 40 MG/0.4ML ~~LOC~~ SOLN
40.0000 mg | SUBCUTANEOUS | Status: DC
  Administered 2013-11-29 – 2013-12-03 (×5): 40 mg via SUBCUTANEOUS
  Filled 2013-11-29 (×6): qty 0.4

## 2013-11-29 MED ORDER — FUROSEMIDE 10 MG/ML IJ SOLN
40.0000 mg | Freq: Once | INTRAMUSCULAR | Status: AC
  Administered 2013-11-29: 40 mg via INTRAVENOUS
  Filled 2013-11-29: qty 4

## 2013-11-29 MED ORDER — SODIUM CHLORIDE 0.9 % IJ SOLN
3.0000 mL | Freq: Two times a day (BID) | INTRAMUSCULAR | Status: DC
  Administered 2013-11-29: 3 mL via INTRAVENOUS

## 2013-11-29 MED ORDER — ACETAMINOPHEN 325 MG PO TABS
650.0000 mg | ORAL_TABLET | Freq: Four times a day (QID) | ORAL | Status: DC | PRN
  Administered 2013-11-29 – 2013-12-01 (×2): 650 mg via ORAL
  Filled 2013-11-29 (×2): qty 2

## 2013-11-29 MED ORDER — ACETAMINOPHEN 650 MG RE SUPP
650.0000 mg | Freq: Four times a day (QID) | RECTAL | Status: DC | PRN
Start: 2013-11-29 — End: 2013-12-04

## 2013-11-29 MED ORDER — DILTIAZEM HCL 100 MG IV SOLR
5.0000 mg/h | INTRAVENOUS | Status: DC
  Administered 2013-11-29: 10 mg/h via INTRAVENOUS
  Filled 2013-11-29 (×3): qty 100

## 2013-11-29 MED ORDER — METOPROLOL TARTRATE 25 MG PO TABS
25.0000 mg | ORAL_TABLET | Freq: Two times a day (BID) | ORAL | Status: DC
  Filled 2013-11-29: qty 1

## 2013-11-29 MED ORDER — SODIUM CHLORIDE 0.9 % IV SOLN
INTRAVENOUS | Status: DC
  Administered 2013-11-29: 15:00:00 via INTRAVENOUS

## 2013-11-29 NOTE — ED Notes (Signed)
To ED from [facility] vis [facility], CP onset today, A-fib on EMS arrival, rate 118-190, BP stable, CBG 132, 10mg  Cardizen and 4 mg Zofran pta, 18g right AC, A/O

## 2013-11-29 NOTE — ED Provider Notes (Signed)
CSN: [id number]     Arrival date & time 11/29/13  1224 History   First MD Initiated Contact with Patient 11/29/13 1257     Chief Complaint  Patient presents with  . Atrial Fibrillation   (Consider location/radiation/quality/duration/timing/severity/associated sxs/prior Treatment) The history is provided by the nursing home and a relative. The history is limited by the condition of the patient.   80 year old female with known history of dementia. I admitted her back in December mental status is currently at baseline. Sent in from [facility] home arrived by EMS questionable complained of chest pain. EMS noted rapid heart rate up to 190 consistent with atrial fib. Patient's blood pressure was stable blood sugar was 132. In route they gave her 10 mg of Cardizem and 4 mg of Zofran. Patient has had some nausea and some vomiting. Level V caveat applies to the history due to baseline dementia. Patient denies any chest pain here. Patient denies any complaint here.  Past Medical History  Diagnosis Date  . Cardiac dysrhythmia, unspecified   . Unspecified essential hypertension   . Other and unspecified hyperlipidemia   . Atrial fibrillation   . CAD (coronary artery disease)   . Complication of anesthesia     Takes a long time to wake up  . Dementia   . Pulmonary HTN 10/29/2013   Past Surgical History  Procedure Laterality Date  . Coronary artery bypass graft    . Abdominal hysterectomy    . Orthopedic surgery     Family History  Problem Relation Age of Onset  . Heart disease Father   . Heart disease Father   . Heart disease Sister     brother  . Cancer Sister     ovarian, throat  . Leukemia Brother    History  Substance Use Topics  . Smoking status: Never Smoker   . Smokeless tobacco: Never Used  . Alcohol Use: No   OB History   Grav Para Term Preterm Abortions TAB SAB Ect Mult Living                 Review of Systems  Unable to perform ROS  5 caveat applies due to to  dementia.  Allergies  Review of patient's allergies indicates no known allergies.  Home Medications   Current Outpatient Rx  Name  Route  Sig  Dispense  Refill  . meclizine (ANTIVERT) 25 MG tablet   Oral   Take 25 mg by mouth 4 (four) times daily as needed for dizziness (for 4 days at a time).         . metoprolol tartrate (LOPRESSOR) 25 MG tablet   Oral   Take 25 mg by mouth 2 (two) times daily.         . ondansetron (ZOFRAN ODT) 4 MG disintegrating tablet   Oral   Take 1 tablet (4 mg total) by mouth every 8 (eight) hours as needed for nausea or vomiting.   12 tablet   1   . triamcinolone cream (KENALOG) 0.1 %   Topical   Apply 1 application topically daily as needed (to fave for eczema).          BP 97/65  Pulse 50  Resp 21  SpO2 97% Physical Exam  Nursing note and vitals reviewed. Constitutional: She appears well-developed and well-nourished. No distress.  HENT:  Head: Normocephalic and atraumatic.  Mouth/Throat: Oropharynx is clear and moist.  Eyes: Conjunctivae and EOM are normal. Pupils are equal, round, and  reactive to light.  Neck: Normal range of motion.  Cardiovascular: Normal heart sounds.   No murmur heard. Tachycardic and irregular  Pulmonary/Chest: Effort normal. She has rales.  Abdominal: Soft. Bowel sounds are normal. There is no tenderness.  Musculoskeletal: Normal range of motion. She exhibits no edema.  Neurological: She is alert. No cranial nerve deficit. She exhibits normal muscle tone. Coordination normal.  Skin: Skin is warm. No rash noted.    ED Course  Procedures (including critical care time) Labs Review Labs Reviewed  COMPREHENSIVE METABOLIC PANEL - Abnormal; Notable for the following:    Glucose, Bld 110 (*)    Calcium 7.6 (*)    Total Protein 5.1 (*)    Albumin 2.8 (*)    ALT 38 (*)    GFR calc non Af Amer 74 (*)    GFR calc Af Amer 86 (*)    All other components within normal limits  CBC WITH DIFFERENTIAL - Abnormal;  Notable for the following:    WBC 17.7 (*)    Hemoglobin 11.0 (*)    HCT 35.1 (*)    MCH 25.1 (*)    Neutrophils Relative % 78 (*)    Neutro Abs 13.8 (*)    Monocytes Absolute 1.5 (*)    All other components within normal limits  TROPONIN I  URINALYSIS, ROUTINE W REFLEX MICROSCOPIC  PRO B NATRIURETIC PEPTIDE   Results for orders placed during the hospital encounter of 11/29/13  TROPONIN I      Result Value Range   Troponin I <0.30  <0.30 ng/mL  COMPREHENSIVE METABOLIC PANEL      Result Value Range   Sodium 143  137 - 147 mEq/L   Potassium 4.3  3.7 - 5.3 mEq/L   Chloride 109  96 - 112 mEq/L   CO2 21  19 - 32 mEq/L   Glucose, Bld 110 (*) 70 - 99 mg/dL   BUN 17  6 - 23 mg/dL   Creatinine, Ser 7.620.74  0.50 - 1.10 mg/dL   Calcium 7.6 (*) 8.4 - 10.5 mg/dL   Total Protein 5.1 (*) 6.0 - 8.3 g/dL   Albumin 2.8 (*) 3.5 - 5.2 g/dL   AST 33  0 - 37 U/L   ALT 38 (*) 0 - 35 U/L   Alkaline Phosphatase 65  39 - 117 U/L   Total Bilirubin 0.5  0.3 - 1.2 mg/dL   GFR calc non Af Amer 74 (*) >90 mL/min   GFR calc Af Amer 86 (*) >90 mL/min  CBC WITH DIFFERENTIAL      Result Value Range   WBC 17.7 (*) 4.0 - 10.5 K/uL   RBC 4.39  3.87 - 5.11 MIL/uL   Hemoglobin 11.0 (*) 12.0 - 15.0 g/dL   HCT 83.135.1 (*) 51.736.0 - 61.646.0 %   MCV 80.0  78.0 - 100.0 fL   MCH 25.1 (*) 26.0 - 34.0 pg   MCHC 31.3  30.0 - 36.0 g/dL   RDW 07.314.9  71.011.5 - 62.615.5 %   Platelets 349  150 - 400 K/uL   Neutrophils Relative % 78 (*) 43 - 77 %   Neutro Abs 13.8 (*) 1.7 - 7.7 K/uL   Lymphocytes Relative 13  12 - 46 %   Lymphs Abs 2.2  0.7 - 4.0 K/uL   Monocytes Relative 8  3 - 12 %   Monocytes Absolute 1.5 (*) 0.1 - 1.0 K/uL   Eosinophils Relative 1  0 - 5 %  Eosinophils Absolute 0.2  0.0 - 0.7 K/uL   Basophils Relative 0  0 - 1 %   Basophils Absolute 0.0  0.0 - 0.1 K/uL  URINALYSIS, ROUTINE W REFLEX MICROSCOPIC      Result Value Range   Color, Urine YELLOW  YELLOW   APPearance CLEAR  CLEAR   Specific Gravity, Urine 1.013   1.005 - 1.030   pH 5.5  5.0 - 8.0   Glucose, UA NEGATIVE  NEGATIVE mg/dL   Hgb urine dipstick NEGATIVE  NEGATIVE   Bilirubin Urine NEGATIVE  NEGATIVE   Ketones, ur NEGATIVE  NEGATIVE mg/dL   Protein, ur NEGATIVE  NEGATIVE mg/dL   Urobilinogen, UA 0.2  0.0 - 1.0 mg/dL   Nitrite NEGATIVE  NEGATIVE   Leukocytes, UA NEGATIVE  NEGATIVE    Imaging Review Dg Chest Port 1 View  11/29/2013   CLINICAL DATA:  Atrial fibrillation, shortness of breath, history hypertension, coronary artery disease, pulmonary hypertension  EXAM: PORTABLE CHEST - 1 VIEW  COMPARISON:  Portable exam 1402 hr compared to 11/17/2013  FINDINGS: Enlargement of cardiac silhouette post CABG.  Pulmonary vascular congestion.  Scattered interstitial infiltrates most likely representing mild pulmonary edema and CHF.  No segmental consolidation, pleural effusion or pneumothorax.  Bones demineralized.  IMPRESSION: Probable mild CHF.   Electronically Signed   By: Ulyses Southward M.D.   On: 11/29/2013 14:16    EKG Interpretation    Date/Time:  Wednesday November 29 2013 12:29:30 EST Ventricular Rate:  113 PR Interval:    QRS Duration: 94 QT Interval:  304 QTC Calculation: 416 R Axis:   -3 Text Interpretation:  Atrial fibrillation with rapid ventricular response with premature ventricular or aberrantly conducted complexes Anteroseptal infarct , age undetermined ST \\T \ T wave abnormality, consider inferolateral ischemia Abnormal ECG No significant change since last tracing Confirmed by Jasia Hiltunen  MD, Liyat Faulkenberry ([id number]) on 11/29/2013 1:38:31 PM          CRITICAL CARE Performed by: Shelda Jakes. Total critical care time: 30 Critical care time was exclusive of separately billable procedures and treating other patients. Critical care was necessary to treat or prevent imminent or life-threatening deterioration. Critical care was time spent personally by me on the following activities: development of treatment plan with patient and/or  surrogate as well as nursing, discussions with consultants, evaluation of patient's response to treatment, examination of patient, obtaining history from patient or surrogate, ordering and performing treatments and interventions, ordering and review of laboratory studies, ordering and review of radiographic studies, pulse oximetry and re-evaluation of patient's condition.   MDM   1. Atrial fibrillation   2. CHF (congestive heart failure)    The patient with persistent atrial fib. Patient improved somewhat after the diltiazem a.m. bolus provided prior to arrival. Patient heart rate still occasionally getting up into the upper 130s. Diltiazem and drip started will be titrated carefully against her blood pressure which is not that significantly high. Chest x-ray shows some mild CHF maybe related to the atrial fib irritable treated with some Lasix. Troponin negative. Patient will require admission Dr. Pete Glatter is her doctor.  In addition patient is noted to have a leukocytosis. Concern perhaps for some other developing type infection. Lactic acid ordered and blood cultures ordered prophylactically. Certainly no evidence of infection on chest x-ray currently or on urinalysis.  Patient started on diltiazem and drip for the persistent atrial fib which may be contributing to the congestive heart failure. Continued cardiac rule out and monitoring of her CHF  will be necessary. Patient also received 40 mg of Lasix IV. Patient will be admitted to telemetry hospitalist service. Temporary admit orders done.  Shelda Jakes, MD 11/29/13 [phone]

## 2013-11-29 NOTE — Consult Note (Signed)
CARDIOLOGY CONSULT NOTE  Patient ID: [name], MRN: [id number], DOB/AGE: 1923/10/27 80 y.o. Admit date: 11/29/2013 Date of Consult: 11/29/2013  Primary Physician: Ginette Otto, MD Primary Cardiologist: Dr. Everette Rank  Chief Complaint: afib with RVR Reason for Consultation: assistance with management  HPI: 80 y.o. female w/ PMHx significant for CAD s/p CABG 2006, h/o afib, HTN, dementia who presented to [facility] on 11/29/2013 from assisted living with complaints of rapid heart rate. Notably, she was just recently discharged from the hospital on 1/5 with pneumonia. Treated with IV abx initially and transferred to PO and discharged to SNF. During that hospitalization, she was reportedly in sinus tachy with freq PACs. Echo performed demonstrating EF 55%, mild pulm HTN (43 mmHg), MAC and mild MR, mod LAE.   Daughter provided most of history. Reports several hospitalizations in the past 1-2 months with similar fast heart rate and short of breath. Pt without complaints but states she can feel it when it is going fast. No chest pain. Mild short of breath previously but not now.  Previously on coumadin, statin, ACEI but with dementia and inability to reliably take pills, these have been discontinued. Now only on metoprolol which even this she pockets in her checks.  EKG demonstrates afib with Rates of 113. Inferolateral ST changes that are unchanged from 1/3.  On dilt gtt.  Initial troponin of <0.3. BNP 2000 (up from 600).  TSH last hospital wnl.    Past Medical History  Diagnosis Date  . Cardiac dysrhythmia, unspecified   . Unspecified essential hypertension   . Other and unspecified hyperlipidemia   . Atrial fibrillation   . CAD (coronary artery disease)   . Complication of anesthesia     Takes a long time to wake up  . Dementia   . Pulmonary HTN 10/29/2013      Surgical History:  Past Surgical History  Procedure Laterality Date  . Coronary artery bypass graft    .  Abdominal hysterectomy    . Orthopedic surgery       Home Meds: Prior to Admission medications   Medication Sig Start Date End Date Taking? Authorizing Provider  meclizine (ANTIVERT) 25 MG tablet Take 25 mg by mouth 4 (four) times daily as needed for dizziness (for 4 days at a time).   Yes Historical Provider, MD  metoprolol tartrate (LOPRESSOR) 25 MG tablet Take 25 mg by mouth 2 (two) times daily.   Yes Historical Provider, MD  ondansetron (ZOFRAN ODT) 4 MG disintegrating tablet Take 1 tablet (4 mg total) by mouth every 8 (eight) hours as needed for nausea or vomiting. 10/23/13  Yes Shelda Jakes, MD  triamcinolone cream (KENALOG) 0.1 % Apply 1 application topically daily as needed (to fave for eczema).   Yes Historical Provider, MD    Inpatient Medications:  . sodium chloride   Intravenous STAT  . enoxaparin (LOVENOX) injection  40 mg Subcutaneous Q24H  . [START ON 11/30/2013] furosemide  40 mg Intravenous Daily  . metoprolol tartrate  50 mg Oral BID  . sodium chloride  3 mL Intravenous Q12H   . diltiazem (CARDIZEM) infusion 10 mg/hr (11/29/13 1930)    Allergies: No Known Allergies  History   Social History  . Marital Status: Widowed    Spouse Name: N/A    Number of Children: N/A  . Years of Education: N/A   Occupational History  . retired    Social History Main Topics  . Smoking status: Never Smoker   .  Smokeless tobacco: Never Used  . Alcohol Use: No  . Drug Use: No  . Sexual Activity: Not Currently   Other Topics Concern  . Not on file   Social History Narrative  . No narrative on file     Family History  Problem Relation Age of Onset  . Heart disease Father   . Heart disease Father   . Heart disease Sister     brother  . Cancer Sister     ovarian, throat  . Leukemia Brother      Review of Systems: Limited due to advanced dementia.  Labs:  Recent Labs  11/29/13 1346 11/29/13 1854  TROPONINI <0.30 <0.30   Lab Results  Component Value  Date   WBC 17.7* 11/29/2013   HGB 11.0* 11/29/2013   HCT 35.1* 11/29/2013   MCV 80.0 11/29/2013   PLT 349 11/29/2013    Recent Labs Lab 11/29/13 1346  NA 143  K 4.3  CL 109  CO2 21  BUN 17  CREATININE 0.74  CALCIUM 7.6*  PROT 5.1*  BILITOT 0.5  ALKPHOS 65  ALT 38*  AST 33  GLUCOSE 110*   Lab Results  Component Value Date   CHOL  Value: 136        ATP III CLASSIFICATION:  <200     mg/dL   Desirable  409-811200-239  mg/dL   Borderline High  >=914>=240    mg/dL   High        7/82/95622/19/2010   HDL 53 12/14/2008   LDLCALC  Value: 49        Total Cholesterol/HDL:CHD Risk Coronary Heart Disease Risk Table                     Men   Women  1/2 Average Risk   3.4   3.3  Average Risk       5.0   4.4  2 X Average Risk   9.6   7.1  3 X Average Risk  23.4   11.0        Use the calculated Patient Ratio above and the CHD Risk Table to determine the patient's CHD Risk.        ATP III CLASSIFICATION (LDL):  <100     mg/dL   Optimal  130-865100-129  mg/dL   Near or Above                    Optimal  130-159  mg/dL   Borderline  784-696160-189  mg/dL   High  >295>190     mg/dL   Very High 2/84/13242/19/2010   TRIG 169* 12/14/2008   No results found for this basename: DDIMER    Radiology/Studies:  Dg Chest Port 1 View  11/29/2013   CLINICAL DATA:  Atrial fibrillation, shortness of breath, history hypertension, coronary artery disease, pulmonary hypertension  EXAM: PORTABLE CHEST - 1 VIEW  COMPARISON:  Portable exam 1402 hr compared to 11/17/2013  FINDINGS: Enlargement of cardiac silhouette post CABG.  Pulmonary vascular congestion.  Scattered interstitial infiltrates most likely representing mild pulmonary edema and CHF.  No segmental consolidation, pleural effusion or pneumothorax.  Bones demineralized.  IMPRESSION: Probable mild CHF.   Electronically Signed   By: Ulyses SouthwardMark  Boles M.D.   On: 11/29/2013 14:16    EKG: afib with rate of 113, lateral ST inversions with mild depr, unchanged from prior  Physical Exam: Blood pressure 117/76, pulse 119, temperature  98 F (36.7 C), temperature source  Oral, resp. rate 22, height 5\' 2"  (1.575 m), weight 66 kg (145 lb 8.1 oz), SpO2 92.00%. General: Well developed, well nourished, in no acute distress. Head: Normocephalic, atraumatic, sclera non-icteric, no xanthomas, nares are without discharge.  Neck: Supple. JVD at 15 cm Lungs: crackles at base Heart: irreg, irreg, no m/r/g Abdomen: Soft, non-tender, non-distended with normoactive bowel sounds. No hepatomegaly. No rebound/guarding. No obvious abdominal masses. Msk:  Strength and tone appear normal for age. Extremities: No clubbing or cyanosis. L > R edema (chronic due to vein stripping) Neuro: Alert. Moves all extremities spontaneously. Psych:  Answers direct questions appropriately but not oriented.   Problem List 1. Afib with RVR 2. CAD s/p CABG 3. Heart failure with preserved ejection fraction; mildly decompensated 4. Dementia, difficulty taking pills  Assessment and Plan:  80 y.o. female w/ PMHx significant for CAD s/p CABG 2006, h/o afib, HTN, dementia who presented to [facility] on 11/29/2013 from assisted living with complaints of rapid heart rate, found to be in afib with RVR.  Long discussion with family regarding afib, heart failure with preserved EF, and the difficulties in controlling these when PO intake of medications is variable. Understand goals of minimizing medications and avoiding re-hospitalization. Recommend from cardiac standpoint, increased nodal blockade and low dose lasix if possible as long term meds. Addition of aspirin 162 mg for stroke prophylaxis if possible but priority is the first 2 medications.  Mildly fluid overloaded. Likely combination of afib with RVR with HFpEF which needs prolonged diastolic period for proper filling. IV lasix and then transition to PO.  Difficulty with PO meds is going to make outpatient management of her cardiac issues difficult. Discussed this with family.   Summary of Recs: 1. Agree  with transitioning off diltiazem once increased metoprolol dose takes effect, goal HR of ~100 bpm. 2. Agree with lasix IV, goal -1-2 L/day, would restart as outpatient lasix orally.  Thank you for this consult. Please call with questions. We will follow up in the AM.  Signed, Rigel Filsinger C. MD 11/29/2013, 8:12 PM

## 2013-11-29 NOTE — H&P (Addendum)
[facility] History and Physical  NALLA PURDY RUE:[id number] DOB: 1924-02-07 DOA: 11/29/2013  Referring physician: EDP PCP: Ginette Otto, MD   Chief Complaint: SENT IN FROM SNF for a fib,.   HPI: [name] is a 80 y.o. female with prior h/o CAD s/p CABG, af ib not on anticoagulation because of frequent falls, pulm hypertension, DEMENTIA, was brought in from SNF for afib with RVR. Pt has moderate to severe dementia, and is a poor historian. Most of hisotry obtained from the daughters. As per the daughter she has severe dementia, doesn't take medications. Did not have any specific symptoms over the last few days. On arrival to ED, she was found to be in afib with rates in 150's, was started on cardizem drip and referred to hospitalist service for admission. Her cxr revealed mild chf. . No pedal edema present. She appears calm but confused.    Review of Systems:  Could not be obtained  Past Medical History  Diagnosis Date  . Cardiac dysrhythmia, unspecified   . Unspecified essential hypertension   . Other and unspecified hyperlipidemia   . Atrial fibrillation   . CAD (coronary artery disease)   . Complication of anesthesia     Takes a long time to wake up  . Dementia   . Pulmonary HTN 10/29/2013   Past Surgical History  Procedure Laterality Date  . Coronary artery bypass graft    . Abdominal hysterectomy    . Orthopedic surgery     Social History:  reports that she has never smoked. She has never used smokeless tobacco. She reports that she does not drink alcohol or use illicit drugs.  No Known Allergies  Family History  Problem Relation Age of Onset  . Heart disease Father   . Heart disease Father   . Heart disease Sister     brother  . Cancer Sister     ovarian, throat  . Leukemia Brother      Prior to Admission medications   Medication Sig Start Date End Date Taking? Authorizing Provider  meclizine (ANTIVERT) 25 MG tablet Take 25 mg by mouth 4 (four)  times daily as needed for dizziness (for 4 days at a time).   Yes Historical Provider, MD  metoprolol tartrate (LOPRESSOR) 25 MG tablet Take 25 mg by mouth 2 (two) times daily.   Yes Historical Provider, MD  ondansetron (ZOFRAN ODT) 4 MG disintegrating tablet Take 1 tablet (4 mg total) by mouth every 8 (eight) hours as needed for nausea or vomiting. 10/23/13  Yes Shelda Jakes, MD  triamcinolone cream (KENALOG) 0.1 % Apply 1 application topically daily as needed (to fave for eczema).   Yes Historical Provider, MD   Physical Exam: Filed Vitals:   11/29/13 1700  BP: 117/76  Pulse: 119  Temp: 98 F (36.7 C)  Resp: 22    BP 117/76  Pulse 119  Temp(Src) 98 F (36.7 C) (Oral)  Resp 22  Ht 5\' 2"  (1.575 m)  Wt 66 kg (145 lb 8.1 oz)  BMI 26.61 kg/m2  SpO2 92%  General:  Appears calm and comfortable Eyes: PERRL, normal lids, irises & conjunctiva ENT: grossly normal hearing, lips & tongue Neck: no LAD, masses or thyromegaly Cardiovascular: RRR, no m/r/g. No LE edema. Telemetry: SR, no arrhythmias  Respiratory: CTA bilaterally, no w/r/r. Normal respiratory effort. Abdomen: soft, ntnd Skin: no rash or induration seen on limited exam Musculoskeletal: grossly normal tone BUE/BLE Psychiatric: grossly normal mood and affect, speech fluent  and appropriate Neurologic: grossly non-focal.          Labs on Admission:  Basic Metabolic Panel:  Recent Labs Lab 11/29/13 1346  NA 143  K 4.3  CL 109  CO2 21  GLUCOSE 110*  BUN 17  CREATININE 0.74  CALCIUM 7.6*   Liver Function Tests:  Recent Labs Lab 11/29/13 1346  AST 33  ALT 38*  ALKPHOS 65  BILITOT 0.5  PROT 5.1*  ALBUMIN 2.8*   No results found for this basename: LIPASE, AMYLASE,  in the last 168 hours No results found for this basename: AMMONIA,  in the last 168 hours CBC:  Recent Labs Lab 11/29/13 1346  WBC 17.7*  NEUTROABS 13.8*  HGB 11.0*  HCT 35.1*  MCV 80.0  PLT 349   Cardiac Enzymes:  Recent  Labs Lab 11/29/13 1346 11/29/13 1854  TROPONINI <0.30 <0.30    BNP (last 3 results)  Recent Labs  10/27/13 1750 11/29/13 1443  PROBNP 657.5* 2016.0*   CBG: No results found for this basename: GLUCAP,  in the last 168 hours  Radiological Exams on Admission: Dg Chest Port 1 View  11/29/2013   CLINICAL DATA:  Atrial fibrillation, shortness of breath, history hypertension, coronary artery disease, pulmonary hypertension  EXAM: PORTABLE CHEST - 1 VIEW  COMPARISON:  Portable exam 1402 hr compared to 11/17/2013  FINDINGS: Enlargement of cardiac silhouette post CABG.  Pulmonary vascular congestion.  Scattered interstitial infiltrates most likely representing mild pulmonary edema and CHF.  No segmental consolidation, pleural effusion or pneumothorax.  Bones demineralized.  IMPRESSION: Probable mild CHF.   Electronically Signed   By: Ulyses SouthwardMark  Boles M.D.   On: 11/29/2013 14:16    EKG: afib with RVR.   Assessment/Plan Active Problems:   HYPERTENSION   Atrial fibrillation   COPD, MILD   Dementia   Pulmonary HTN   A-fib  Atrial fibrillation with RVR;  Driven probably from the mild CHF.   as per the family she has a h/o atrial fibrillation.    She is on metoprolol for rate control. She was on coumadin in the past, but was discontinued because of her history of falling. She was started on cardizem drip to control her rate. Her [place] 2 score is 3.  Will consult cardiology for further recommendations.     Mild CHF exacerbation/ pulmonary hypertension: Admitted to telemetry.  Started on IV lasix, with intake and output.  CXR shows mild chf picture.  Pro bnp is 2016 Daily weight's.  Echo last month showed preserved LV EF, with pulm hypertension.  Cardiology consulted for further recommendations.    Leukocytosis: uclear etiology. No source of infection found so far.  cxr NEG UA neg for infection Blood cultures done and pending.  Skin intact.   Hypertension.  Controlled.   Dementia;   Mod to severe.  Not on medications, as per the daughter, they have tried all the medications, and no improvement.   Dysphagia;  Seen by Dr Rhea BeltonPyrtle in the past, underwent EGD .  Will get SLP in am.  Pureed diet tonight.   DVT prophylaxis.      Code Status:DNR Family Communication: discussed with daughter at bedside Disposition Plan: admitted to telemetry.   Time spent: 5575 m in  [facility] A Medical CorporationKULA,Jetta Murray [facility] Pager [phone]

## 2013-11-30 ENCOUNTER — Inpatient Hospital Stay (HOSPITAL_COMMUNITY): Payer: Medicare HMO

## 2013-11-30 DIAGNOSIS — I2510 Atherosclerotic heart disease of native coronary artery without angina pectoris: Secondary | ICD-10-CM

## 2013-11-30 DIAGNOSIS — I5033 Acute on chronic diastolic (congestive) heart failure: Principal | ICD-10-CM

## 2013-11-30 DIAGNOSIS — I5032 Chronic diastolic (congestive) heart failure: Secondary | ICD-10-CM | POA: Diagnosis present

## 2013-11-30 DIAGNOSIS — I4891 Unspecified atrial fibrillation: Secondary | ICD-10-CM

## 2013-11-30 LAB — TROPONIN I
Troponin I: <0.3 ng/mL (ref <0.30)
Troponin I: <0.3 ng/mL (ref <0.30)

## 2013-11-30 LAB — CG4 I-STAT (LACTIC ACID): LACTIC ACID, VENOUS: 1.13 mmol/L (ref 0.5–2.2)

## 2013-11-30 LAB — SAVE SMEAR

## 2013-11-30 MED ORDER — FUROSEMIDE 10 MG/ML IJ SOLN
40.0000 mg | Freq: Every day | INTRAMUSCULAR | Status: DC
  Filled 2013-11-30: qty 4

## 2013-11-30 MED ORDER — IOHEXOL 300 MG/ML  SOLN: 100.0000 mL | Freq: Once | INTRAMUSCULAR | Status: AC | PRN

## 2013-11-30 MED ORDER — IOHEXOL 300 MG/ML  SOLN
25.0000 mL | INTRAMUSCULAR | Status: AC
  Administered 2013-11-30 (×2): 25 mL via ORAL

## 2013-11-30 MED ORDER — ENSURE COMPLETE PO LIQD
237.0000 mL | Freq: Two times a day (BID) | ORAL | Status: DC
  Administered 2013-11-30 – 2013-12-04 (×8): 237 mL via ORAL

## 2013-11-30 MED ORDER — HALOPERIDOL LACTATE 5 MG/ML IJ SOLN
2.0000 mg | Freq: Once | INTRAMUSCULAR | Status: AC
  Administered 2013-11-30: 2 mg via INTRAVENOUS
  Filled 2013-11-30: qty 1

## 2013-11-30 MED ORDER — METOPROLOL TARTRATE 50 MG PO TABS
75.0000 mg | ORAL_TABLET | Freq: Two times a day (BID) | ORAL | Status: DC
  Administered 2013-11-30: 75 mg via ORAL
  Filled 2013-11-30 (×3): qty 1

## 2013-11-30 MED ORDER — RISPERIDONE 0.25 MG PO TABS
0.2500 mg | ORAL_TABLET | Freq: Every day | ORAL | Status: DC
  Administered 2013-11-30: 0.25 mg via ORAL
  Filled 2013-11-30 (×2): qty 1

## 2013-11-30 MED ORDER — CLONAZEPAM 0.5 MG PO TABS
0.2500 mg | ORAL_TABLET | Freq: Once | ORAL | Status: AC
  Administered 2013-12-01: 0.25 mg via ORAL
  Filled 2013-11-30: qty 1

## 2013-11-30 MED ORDER — HALOPERIDOL LACTATE 5 MG/ML IJ SOLN
2.0000 mg | Freq: Four times a day (QID) | INTRAMUSCULAR | Status: DC | PRN
  Administered 2013-12-01: 2 mg via INTRAVENOUS
  Filled 2013-11-30: qty 1

## 2013-11-30 MED ORDER — DILTIAZEM HCL 30 MG PO TABS
30.0000 mg | ORAL_TABLET | Freq: Four times a day (QID) | ORAL | Status: DC
  Administered 2013-11-30 – 2013-12-01 (×3): 30 mg via ORAL
  Filled 2013-11-30 (×8): qty 1

## 2013-11-30 MED ORDER — DILTIAZEM HCL 60 MG PO TABS
60.0000 mg | ORAL_TABLET | Freq: Four times a day (QID) | ORAL | Status: DC
  Filled 2013-11-30 (×4): qty 1

## 2013-11-30 MED ORDER — SODIUM CHLORIDE 0.9 % IV BOLUS (SEPSIS): 500.0000 mL | Freq: Once | INTRAVENOUS | Status: DC

## 2013-11-30 NOTE — Progress Notes (Signed)
[facility] PROGRESS NOTE Interim History: 80 y.o. female with prior h/o CAD s/p CABG, af ib not on anticoagulation because of frequent falls, pulm hypertension, DEMENTIA, was brought in from SNF for afib with RVR. Pt has moderate to severe dementia, and is a poor historian. Most of hisotry obtained from the daughters. As per the daughter she has severe dementia, doesn't take medications. Did not have any specific symptoms over the last few days. On arrival to ED, she was found to be in afib with rates in 150's, was started on cardizem drip and referred to hospitalist service for admission   Assessment/Plan: A-fib/Chronic diastolic heart failure/  Pulmonary HTN: - Started on Cardizem drip. Will change to orals. - Cont IV lasix, strict I and O's. Daily weights. - Echo showed mild diastolic heart failure. - Weight 64.9 kg.  Leukocytosis:  - Unclear etiology. No source of infection found so far.  - UA neg for infection  - Blood cultures done and pending.  - Skin intact, peripheral smear, check CT abd and pelvis  Dementia - Mod to severe.  - Not on medications, as per the daughter, they have tried all the medications. - Risperdal QHS, haldol prn.  HYPERTENSION: Stable.   Code Status:DNR  Family Communication: discussed with daughter at bedside  Disposition Plan: admitted to telemetry.     Consultants:  cardiology  Procedures:  CT abd and pelvis  CXR  Antibiotics:  None  HPI/Subjective: sleepy  Objective: Filed Vitals:   11/29/13 2025 11/30/13 0150 11/30/13 0559 11/30/13 1009  BP: 102/75 102/86 125/86 108/60  Pulse: 114 79 105 95  Temp: 97.6 F (36.4 C) 97.4 F (36.3 C) 97.9 F (36.6 C)   TempSrc: Oral Oral Oral   Resp: 20 20 22    Height:      Weight:   64.9 kg (143 lb 1.3 oz)   SpO2: 98% 94% 94%     Intake/Output Summary (Last 24 hours) at 11/30/13 1018 Last data filed at 11/30/13 0844  Gross per 24 hour  Intake    240 ml  Output    250 ml   Net    -10 ml   Filed Weights   11/29/13 1700 11/30/13 0559  Weight: 66 kg (145 lb 8.1 oz) 64.9 kg (143 lb 1.3 oz)    Exam:  General: Alert, awake, oriented x3, in no acute distress.  HEENT: No bruits, no goiter.  Heart: Regular rate and rhythm, without murmurs, rubs, gallops.  Lungs: Good air movement, good air movement CTA B/L Abdomen: Soft, nontender, nondistended, positive bowel sounds.  Neuro: Grossly intact, nonfocal.   Data Reviewed: Basic Metabolic Panel:  Recent Labs Lab 11/29/13 1346  NA 143  K 4.3  CL 109  CO2 21  GLUCOSE 110*  BUN 17  CREATININE 0.74  CALCIUM 7.6*   Liver Function Tests:  Recent Labs Lab 11/29/13 1346  AST 33  ALT 38*  ALKPHOS 65  BILITOT 0.5  PROT 5.1*  ALBUMIN 2.8*   No results found for this basename: LIPASE, AMYLASE,  in the last 168 hours No results found for this basename: AMMONIA,  in the last 168 hours CBC:  Recent Labs Lab 11/29/13 1346  WBC 17.7*  NEUTROABS 13.8*  HGB 11.0*  HCT 35.1*  MCV 80.0  PLT 349   Cardiac Enzymes:  Recent Labs Lab 11/29/13 1346 11/29/13 1854 11/30/13 0021 11/30/13 0330  TROPONINI <0.30 <0.30 <0.30 <0.30   BNP (last 3 results)  Recent Labs  10/27/13 1750  11/29/13 1443  PROBNP 657.5* 2016.0*   CBG: No results found for this basename: GLUCAP,  in the last 168 hours  Recent Results (from the past 240 hour(s))  CULTURE, BLOOD (ROUTINE X 2)     Status: None   Collection Time    11/29/13  4:10 PM      Result Value Range Status   Specimen Description BLOOD RIGHT FOREARM   Final   Special Requests BOTTLES DRAWN AEROBIC AND ANAEROBIC 5CC   Final   Culture  Setup Time     Final   Value: 11/29/2013 22:16     Performed at [facility]   Culture     Final   Value:        BLOOD CULTURE RECEIVED NO GROWTH TO DATE CULTURE WILL BE HELD FOR 5 DAYS BEFORE ISSUING A FINAL NEGATIVE REPORT     Performed at [facility]   Report Status PENDING   Incomplete  CULTURE,  BLOOD (ROUTINE X 2)     Status: None   Collection Time    11/29/13  4:20 PM      Result Value Range Status   Specimen Description BLOOD ARM LEFT   Final   Special Requests     Final   Value: BOTTLES DRAWN AEROBIC AND ANAEROBIC 10CCBLUE 5CCRED   Culture  Setup Time     Final   Value: 11/29/2013 22:15     Performed at [facility]   Culture     Final   Value:        BLOOD CULTURE RECEIVED NO GROWTH TO DATE CULTURE WILL BE HELD FOR 5 DAYS BEFORE ISSUING A FINAL NEGATIVE REPORT     Performed at [facility]   Report Status PENDING   Incomplete  MRSA PCR SCREENING     Status: None   Collection Time    11/29/13  7:51 PM      Result Value Range Status   MRSA by PCR NEGATIVE  NEGATIVE Final   Comment:            The GeneXpert MRSA Assay (FDA     approved for NASAL specimens     only), is one component of a     comprehensive MRSA colonization     surveillance program. It is not     intended to diagnose MRSA     infection nor to guide or     monitor treatment for     MRSA infections.     Studies: Dg Chest Port 1 View  11/29/2013   CLINICAL DATA:  Atrial fibrillation, shortness of breath, history hypertension, coronary artery disease, pulmonary hypertension  EXAM: PORTABLE CHEST - 1 VIEW  COMPARISON:  Portable exam 1402 hr compared to 11/17/2013  FINDINGS: Enlargement of cardiac silhouette post CABG.  Pulmonary vascular congestion.  Scattered interstitial infiltrates most likely representing mild pulmonary edema and CHF.  No segmental consolidation, pleural effusion or pneumothorax.  Bones demineralized.  IMPRESSION: Probable mild CHF.   Electronically Signed   By: Ulyses SouthwardMark  Boles M.D.   On: 11/29/2013 14:16    Scheduled Meds: . sodium chloride   Intravenous STAT  . enoxaparin (LOVENOX) injection  40 mg Subcutaneous Q24H  . furosemide  40 mg Intravenous Daily  . metoprolol tartrate  50 mg Oral BID  . sodium chloride  3 mL Intravenous Q12H   Continuous Infusions: .  diltiazem (CARDIZEM) infusion 10 mg/hr (11/29/13 1930)     FELIZ ORTIZ, Darin EngelsABRAHAM  [facility] Pager [phone]. If 8PM-8AM, please contact night-coverage at [email or web address], password [facility] 11/30/2013, 10:18 AM  LOS: 1 day

## 2013-11-30 NOTE — Progress Notes (Signed)
Utilization Review Completed.  

## 2013-11-30 NOTE — Evaluation (Signed)
Physical Therapy Evaluation Patient Details Name: [name] MRN: [id number] DOB: March 29, 1924 Today's Date: 11/30/2013 Time: 4098-1191 PT Time Calculation (min): 16 min  PT Assessment / Plan / Recommendation History of Present Illness  80 yo female admitted with afib, cad and confusion.  Pt has been in SNF for rehab.   Clinical Impression  Pt admitted with above. Pt currently with functional limitations due to the deficits listed below (see PT Problem List).  Pt will benefit from skilled PT to increase their independence and safety with mobility to allow discharge to the venue listed below.     PT Assessment  Patient needs continued PT services    Follow Up Recommendations  SNF;Supervision/Assistance - 24 hour        Barriers to Discharge Decreased caregiver support      Equipment Recommendations  Other (comment) (TBA)         Frequency Min 2X/week    Precautions / Restrictions Precautions Precautions: Fall Restrictions Weight Bearing Restrictions: No   Pertinent Vitals/Pain VSS, no pain      Mobility  Bed Mobility Overal bed mobility: Needs Assistance;+ 2 for safety/equipment Bed Mobility: Supine to Sit Supine to sit: +2 for physical assistance;Mod assist;HOB elevated General bed mobility comments: Pt needed assist to initiate movement.  Pt needed assist to move LEs off bed as well as elevation of trunk.   Transfers Overall transfer level: Needs assistance Equipment used: 2 person hand held assist Transfers: Sit to/from Stand Sit to Stand: Mod assist;+2 safety/equipment General transfer comment: Pt needed steadying asssit secondary to posterior lean.   Ambulation/Gait Ambulation/Gait assistance: Min assist;Mod assist;+2 safety/equipment Ambulation Distance (Feet): 45 Feet Assistive device: 2 person hand held assist Gait Pattern/deviations: Step-to pattern;Decreased stride length;Decreased step length - right;Decreased step length - left;Shuffle;Narrow base of  support;Trunk flexed Gait velocity interpretation: <1.8 ft/sec, indicative of risk for recurrent falls General Gait Details: Pt ambulates with somewhat flexed trunk and knees.  Pt needs steadying assist throughout at times losing balance posteriorly.  Pt definitely needing +2 HHA to ambulate.  Pt generally unsteady.           PT Diagnosis: Generalized weakness  PT Problem List: Decreased strength;Decreased activity tolerance;Decreased balance;Decreased mobility;Decreased knowledge of use of DME;Decreased safety awareness PT Treatment Interventions: DME instruction;Gait training;Functional mobility training;Therapeutic activities;Therapeutic exercise;Balance training;Patient/family education     PT Goals(Current goals can be found in the care plan section) Acute Rehab PT Goals Patient Stated Goal: difficult to assess secondary to confusion PT Goal Formulation: With patient Time For Goal Achievement: 12/14/13 Potential to Achieve Goals: Fair  Visit Information  Last PT Received On: 11/30/13 Assistance Needed: +2 (for equipment/safety) History of Present Illness: 79 yo female admitted with afib, cad and confusion.  Pt has been in SNF for rehab.        Prior Functioning  Home Living Family/patient expects to be discharged to:: Skilled nursing facility Prior Function Level of Independence: Needs assistance Gait / Transfers Assistance Needed: min to mod assist with RW ADL's / Homemaking Assistance Needed: Assist by NH Communication Communication: HOH    Cognition  Cognition Arousal/Alertness: Lethargic Behavior During Therapy: Flat affect Overall Cognitive Status: History of cognitive impairments - at baseline Area of Impairment: Orientation;Memory;Following commands;Safety/judgement;Awareness;Problem solving Orientation Level: Disoriented to;Time;Situation;Place Memory: Decreased short-term memory Following Commands: Follows one step commands inconsistently;Follows one step  commands with increased time Safety/Judgement: Decreased awareness of safety;Decreased awareness of deficits Awareness: Intellectual Problem Solving: Decreased initiation;Difficulty sequencing;Requires verbal cues;Requires tactile cues  Extremity/Trunk Assessment Upper Extremity Assessment Upper Extremity Assessment: Defer to OT evaluation Lower Extremity Assessment Lower Extremity Assessment: Generalized weakness Cervical / Trunk Assessment Cervical / Trunk Assessment: Kyphotic   Balance Balance Overall balance assessment: Needs assistance Sitting-balance support: Bilateral upper extremity supported;Feet supported Sitting balance-Leahy Scale: Fair Sitting balance - Comments: Leans posteriorly Postural control: Posterior lean Standing balance support: Bilateral upper extremity supported;During functional activity Standing balance-Leahy Scale: Poor Standing balance comment: Needs assist for balance. High level balance activites: Head turns;Direction changes;Turns High Level Balance Comments: Needs mod asssit with high level activities  End of Session PT - End of Session Equipment Utilized During Treatment: Gait belt Activity Tolerance: Patient limited by fatigue Patient left: in chair;with call bell/phone within reach Nurse Communication: Mobility status       INGOLD,Jesilyn Easom 11/30/2013, 11:01 AM Audree Camelawn Ingold,PT Acute Rehabilitation [phone] (pager)

## 2013-11-30 NOTE — Progress Notes (Signed)
Agree with dietetic intern note. Deyvi Bonanno Barnett RD, LDN Inpatient Clinical Dietitian Pager: [phone] After Hours Pager: [phone]  

## 2013-11-30 NOTE — Progress Notes (Signed)
SLP Cancellation Note  Patient Details Name: [name] MRN: [id number] DOB: [birth year]   Cancelled treatment:       Reason Eval/Treat Not Completed: SLP screened, no needs identified, will sign off. Pt recently evaluated by SLP in January. Discussed pt with daughter who reports no functional change and SLP briefly observed while drinking contrast with no outward evidence of aspiration. Pt cannot have solids at this time due to procedure, but dtr report she is happy with puree and feels this is the diet pt should be on. No SLP f/u, will cancel full evaluation.    Tandra Rosado, Riley NearingBonnie Caroline 11/30/2013, 4:24 PM

## 2013-11-30 NOTE — Progress Notes (Signed)
INITIAL NUTRITION ASSESSMENT  DOCUMENTATION CODES Per approved criteria  -Non-severe (moderate) malnutrition in the context of chronic illness   INTERVENTION: 1.  Ensure Complete BID, each supplement provides 350 kcal and 13 grams protein  2.  Magic cup daily, each supplement provides 290 kcal and 11 grams protein  3.  RD to follow patient care plan  NUTRITION DIAGNOSIS: Inadequate oral intake related to dementia as evidenced by PO intake 20-25%.   Goal: Patient to meet >/= 90% of estimated nutrition needs  Monitor:  Weight trends, lab trends, I/Os, PO intake and supplement acceptance  Reason for Assessment: Nutrition Consult  80 y.o. female  Admitting Dx: A. Fib  ASSESSMENT: Patient with prior h/o CAD s/p CABG, af ib not on anticoagulation because of frequent falls, pulm hypertension, DEMENTIA, was brought in from SNF for afib with RVR.  Unable to collect dietary information from patient, daughter was present at bedside and able to provide dietary information. PTA patient was not eating well at SNF as she does not like the appearance of the pureed food. Patient's daughter reported that her mother will only consume a few bites of each food on her plate. Patient prefers appearance of food in the hospital however, since admission PO intake has been poor at 20-25% meal completion. Patient was drinking Ensure Complete BID at SNF and patient's daughter would like to continue the regimen while admitted.   Dietetic intern discussed ways to increase calorie and protein intake and provided handout "Suggestions for increasing calories and protein" from the [employer] of [employer]. Also discussed ways to incorporate ensure into recipes for smoothies and other desserts.  Patient enjoys drinking Dr. Reino Kent and dietetic intern suspects this is where the majority of her calories come from. Despite insignificant weight loss, patient shows signs of muscle wasting and  malnutrition.  Nutrition Focused Physical Exam:  Subcutaneous Fat:  Orbital Region: WNL Upper Arm Region: WNL Thoracic and Lumbar Region: WNL  Muscle:  Temple Region: moderate depletion Clavicle Bone Region: moderate depletion Clavicle and Acromion Bone Region: moderate depeletion Scapular Bone Region: N/A Dorsal Hand: moderate depeletion Patellar Region: WNL Anterior Thigh Region: WNL Posterior Calf Region: WNL  Edema: absent  Patient meets criteria for non-severe malnutrition in the context of chronic illness as evidenced by <75% energy intake for >/= 1 month and moderate muscle mass depletion.   Height: Ht Readings from Last 1 Encounters:  11/29/13 5\' 2"  (1.575 m)    Weight: Wt Readings from Last 1 Encounters:  11/30/13 143 lb 1.3 oz (64.9 kg)    Ideal Body Weight: 110 lb  % Ideal Body Weight: 130%  Wt Readings from Last 10 Encounters:  11/30/13 143 lb 1.3 oz (64.9 kg)  10/30/13 146 lb 2.6 oz (66.3 kg)  10/23/13 149 lb (67.586 kg)  06/26/11 159 lb (72.122 kg)  06/06/08 158 lb 8 oz (71.895 kg)  03/14/08 160 lb (72.576 kg)  01/06/08 162 lb (73.483 kg)  12/09/07 168 lb 8 oz (76.431 kg)    Usual Body Weight: 148 lb  % Usual Body Weight: 97%  BMI:  Body mass index is 26.16 kg/(m^2).  Estimated Nutritional Needs: Kcal: 1500-1700 Protein: 75-85 grams Fluid: 1.5-1.7 L  Skin: no wounds  Diet Order: Dysphagia 1, thin liquids  EDUCATION NEEDS: -No education needs identified at this time   Intake/Output Summary (Last 24 hours) at 11/30/13 0940 Last data filed at 11/30/13 0844  Gross per 24 hour  Intake    240 ml  Output  250 ml  Net    -10 ml    Last BM: 2/3  Labs:   Recent Labs Lab 11/29/13 1346  NA 143  K 4.3  CL 109  CO2 21  BUN 17  CREATININE 0.74  CALCIUM 7.6*  GLUCOSE 110*    CBG (last 3)  No results found for this basename: GLUCAP,  in the last 72 hours  Scheduled Meds: . sodium chloride   Intravenous STAT  . enoxaparin  (LOVENOX) injection  40 mg Subcutaneous Q24H  . furosemide  40 mg Intravenous Daily  . metoprolol tartrate  50 mg Oral BID  . sodium chloride  3 mL Intravenous Q12H    Continuous Infusions: . diltiazem (CARDIZEM) infusion 10 mg/hr (11/29/13 1930)    Past Medical History  Diagnosis Date  . Cardiac dysrhythmia, unspecified   . Unspecified essential hypertension   . Other and unspecified hyperlipidemia   . Atrial fibrillation   . CAD (coronary artery disease)   . Complication of anesthesia     Takes a long time to wake up  . Dementia   . Pulmonary HTN 10/29/2013    Past Surgical History  Procedure Laterality Date  . Coronary artery bypass graft    . Abdominal hysterectomy    . Orthopedic surgery      Marlane MingleAshley Xareni Kelch, Dietetic Intern Pager: [phone]

## 2013-11-30 NOTE — Progress Notes (Signed)
Pt's daughter requested foley catheter due to several incontinent episodes from IV diuresis. Pt HR also sustaining in the 110s-150s at this time. RN made on-call MD aware. Orders received for Foley. Primary MD and Cardiology MD also on floor soon after to assess pt. Primary MD also put in order for foley catheter. Foley placed. Will continue to monitor.

## 2013-11-30 NOTE — Progress Notes (Addendum)
SUBJECTIVE:  Pleasant but very sleepy.  No complaints  OBJECTIVE:   Vitals:   Filed Vitals:   11/29/13 2025 11/30/13 0150 11/30/13 0559 11/30/13 1009  BP: 102/75 102/86 125/86 108/60  Pulse: 114 79 105 95  Temp: 97.6 F (36.4 C) 97.4 F (36.3 C) 97.9 F (36.6 C)   TempSrc: Oral Oral Oral   Resp: 20 20 22    Height:      Weight:   143 lb 1.3 oz (64.9 kg)   SpO2: 98% 94% 94%    I&O's:   Intake/Output Summary (Last 24 hours) at 11/30/13 1056 Last data filed at 11/30/13 0844  Gross per 24 hour  Intake    240 ml  Output    250 ml  Net    -10 ml   TELEMETRY: Reviewed telemetry pt in atrial fibrillation with variable control.       PHYSICAL EXAM General: Well developed, well nourished, in no acute distress Head: Eyes PERRLA, No xanthomas.   Normal cephalic and atramatic  Lungs:   Crackles at bases 1/3 way up Heart:   Irregularly irregular S1 S2 Pulses are 2+ & equal. Abdomen: Bowel sounds are positive, abdomen soft and non-tender without masses  Extremities:   Bilateral edema Neuro: Alert and oriented X 3. Psych:  Good affect, responds appropriately   LABS: Basic Metabolic Panel:  Recent Labs  16/07/9601/04/15 1346  NA 143  K 4.3  CL 109  CO2 21  GLUCOSE 110*  BUN 17  CREATININE 0.74  CALCIUM 7.6*   Liver Function Tests:  Recent Labs  11/29/13 1346  AST 33  ALT 38*  ALKPHOS 65  BILITOT 0.5  PROT 5.1*  ALBUMIN 2.8*   No results found for this basename: LIPASE, AMYLASE,  in the last 72 hours CBC:  Recent Labs  11/29/13 1346  WBC 17.7*  NEUTROABS 13.8*  HGB 11.0*  HCT 35.1*  MCV 80.0  PLT 349   Cardiac Enzymes:  Recent Labs  11/29/13 1854 11/30/13 0021 11/30/13 0330  TROPONINI <0.30 <0.30 <0.30   BNP: No components found with this basename: POCBNP,  D-Dimer: No results found for this basename: DDIMER,  in the last 72 hours Hemoglobin A1C: No results found for this basename: HGBA1C,  in the last 72 hours Fasting Lipid Panel: No results  found for this basename: CHOL, HDL, LDLCALC, TRIG, CHOLHDL, LDLDIRECT,  in the last 72 hours Thyroid Function Tests: No results found for this basename: TSH, T4TOTAL, FREET3, T3FREE, THYROIDAB,  in the last 72 hours Anemia Panel: No results found for this basename: VITAMINB12, FOLATE, FERRITIN, TIBC, IRON, RETICCTPCT,  in the last 72 hours Coag Panel:   Lab Results  Component Value Date   INR 0.99 10/27/2013   INR 2.4* 12/16/2008   INR 2.4* 12/15/2008    RADIOLOGY: Dg Chest 2 View  11/17/2013   CLINICAL DATA:  Followup pneumonia. Chest pain. Cough. Dyspnea. Coronary artery disease.  EXAM: CHEST  2 VIEW  COMPARISON:  10/27/2013  FINDINGS: Previously seen pulmonary infiltrates are no longer visualized. No evidence of congestive heart failure. Mild cardiomegaly stable. No evidence of pleural effusion.  There is a persistent asymmetric nodular density in the medial right upper lobe on the frontal projection. Small bronchogenic carcinoma cannot be excluded. Prior CABG again noted.  IMPRESSION: Interval clearing of previously seen pulmonary infiltrates.  Persistent asymmetric nodular density in the medial right upper lobe. Small bronchogenic carcinoma cannot be excluded. Chest CT without contrast recommended for further evaluation.  These  results will be called to the ordering clinician or representative by the Radiologist Assistant, and communication documented in the PACS Dashboard.   Electronically Signed   By: Myles Rosenthal M.D.   On: 11/17/2013 14:14   Ct Chest Wo Contrast  11/23/2013   CLINICAL DATA:  Abnormal chest radiograph with right upper lobe density. Cough and shortness of breath.  EXAM: CT CHEST WITHOUT CONTRAST  TECHNIQUE: Multidetector CT imaging of the chest was performed following the standard protocol without IV contrast.  COMPARISON:  DG CHEST 2 VIEW dated 11/17/2013  FINDINGS: Lower right paratracheal lymph node measures 1.6 cm, nonspecific. Hilar regions are difficult to definitively  evaluate without IV contrast. No axillary adenopathy. Atherosclerotic calcification of the arterial vasculature. Heart is enlarged. Mitral annulus calcification. No pericardial effusion.  Mild biapical pleural parenchymal scarring. No pulmonary nodule to account for the questioned abnormality on recent chest radiograph. Respiratory motion somewhat degrades image quality. No pleural fluid. Airway is unremarkable.  Incidental imaging of the upper abdomen shows mild irregularity of the liver margin. No worrisome lytic or sclerotic lesions. Degenerative changes are seen in the spine.  IMPRESSION: 1. No pulmonary nodule to account for the questioned abnormality on recent chest radiograph. 2. No findings to explain the patient's cough and shortness of breath. 3. Irregularity of the liver margin is indicative of cirrhosis.   Electronically Signed   By: Leanna Battles M.D.   On: 11/23/2013 18:35   Dg Chest Port 1 View  11/29/2013   CLINICAL DATA:  Atrial fibrillation, shortness of breath, history hypertension, coronary artery disease, pulmonary hypertension  EXAM: PORTABLE CHEST - 1 VIEW  COMPARISON:  Portable exam 1402 hr compared to 11/17/2013  FINDINGS: Enlargement of cardiac silhouette post CABG.  Pulmonary vascular congestion.  Scattered interstitial infiltrates most likely representing mild pulmonary edema and CHF.  No segmental consolidation, pleural effusion or pneumothorax.  Bones demineralized.  IMPRESSION: Probable mild CHF.   Electronically Signed   By: Ulyses Southward M.D.   On: 11/29/2013 14:16   Problem List  1. Afib with RVR  2. CAD s/p CABG  3. Heart failure with preserved ejection fraction; mildly decompensated  4. Dementia, difficulty taking pills   Assessment and Plan:  80 y.o. female w/ PMHx significant for CAD s/p CABG 2006, h/o afib, HTN, dementia who presented to [facility] on 11/29/2013 from assisted living with complaints of rapid heart rate, found to be in afib with RVR.  Mildly  fluid overloaded. Likely combination of afib with RVR with HFpEF which needs prolonged diastolic period for proper filling. IV lasix and then transition to PO.  Difficulty with PO meds is going to make outpatient management of her cardiac issues difficult. Discussed this with family. May benefit from liquid form of lopressor.  - continue diuretics - Increase Metoprolol to 75mg  BID - will try to max out beta blocker first to minimize number of meds she is taking.   - Continue Cardizem with hopes of weaning off as beta blocker increased     Quintella Reichert, MD  11/30/2013  10:56 AM

## 2013-11-30 NOTE — Progress Notes (Signed)
Pt a/c, no c/o pain, daughter at bedside, pt on IV lasix, urine output adequate, vss, pt stable

## 2013-12-01 DIAGNOSIS — E43 Unspecified severe protein-calorie malnutrition: Secondary | ICD-10-CM | POA: Diagnosis present

## 2013-12-01 DIAGNOSIS — D72829 Elevated white blood cell count, unspecified: Secondary | ICD-10-CM

## 2013-12-01 DIAGNOSIS — E440 Moderate protein-calorie malnutrition: Secondary | ICD-10-CM | POA: Insufficient documentation

## 2013-12-01 LAB — BASIC METABOLIC PANEL
BUN: 16 mg/dL (ref 6–23)
CO2: 29 mEq/L (ref 19–32)
Calcium: 8.7 mg/dL (ref 8.4–10.5)
Chloride: 99 mEq/L (ref 96–112)
Creatinine, Ser: 0.95 mg/dL (ref 0.50–1.10)
GFR calc Af Amer: 61 mL/min — ABNORMAL LOW (ref >90)
GFR calc non Af Amer: 52 mL/min — ABNORMAL LOW (ref >90)
GLUCOSE: 149 mg/dL — AB (ref 70–99)
POTASSIUM: 4 meq/L (ref 3.7–5.3)
Sodium: 139 mEq/L (ref 137–147)

## 2013-12-01 MED ORDER — RISPERIDONE 0.5 MG PO TABS
0.5000 mg | ORAL_TABLET | Freq: Every day | ORAL | Status: DC
  Administered 2013-12-01 – 2013-12-03 (×3): 0.5 mg via ORAL
  Filled 2013-12-01 (×4): qty 1

## 2013-12-01 MED ORDER — METOPROLOL TARTRATE 100 MG PO TABS
100.0000 mg | ORAL_TABLET | Freq: Two times a day (BID) | ORAL | Status: DC
  Administered 2013-12-01 – 2013-12-02 (×3): 100 mg via ORAL
  Filled 2013-12-01 (×4): qty 1

## 2013-12-01 MED ORDER — FUROSEMIDE 10 MG/ML IJ SOLN
4.0000 mg/h | INTRAVENOUS | Status: DC
  Administered 2013-12-01: 4 mg/h via INTRAVENOUS
  Filled 2013-12-01: qty 25

## 2013-12-01 MED ORDER — DILTIAZEM HCL 100 MG IV SOLR
5.0000 mg/h | INTRAVENOUS | Status: DC
  Administered 2013-12-01: 5 mg/h via INTRAVENOUS
  Filled 2013-12-01: qty 100

## 2013-12-01 MED ORDER — DILTIAZEM LOAD VIA INFUSION
10.0000 mg | Freq: Once | INTRAVENOUS | Status: DC
  Filled 2013-12-01: qty 10

## 2013-12-01 NOTE — Clinical Social Work Psychosocial (Addendum)
    Clinical Social Work Department BRIEF PSYCHOSOCIAL ASSESSMENT 12/01/2013  Patient:  [name],[name]     Account Number:  [id number]     Admit date:  11/29/2013  Clinical Social Worker:  [name],[name], LCSWA  Date/Time:  11/30/2013 12:00 M  Referred by:  Physician  Date Referred:  11/30/2013 Referred for  SNF Placement   Other Referral:   Interview type:  Family Other interview type:    PSYCHOSOCIAL DATA Living Status:  FACILITY Admitted from facility:  [facility] Level of care:  Assisted Living Primary support name:  [name] ([phone]) [phone]) Primary support relationship to patient:  CHILD, ADULT Degree of support available:   Strong- [name] POA    CURRENT CONCERNS Current Concerns  Post-Acute Placement   Other Concerns:    SOCIAL WORK ASSESSMENT / PLAN CSW called pts contact number on chart.CSW talked to [name], [name] daughter. pt daughter stated that she will not send her mother to a facility for short term rehab. CSW explained that we can't force her mother to go anywhere, and that it is just a recomendation. [name] daighter was thankful for the update on her mother and took down [name], LCSWA infrormation for questions in the future.   Assessment/plan status:  Psychosocial Support/Ongoing Assessment of Needs Other assessment/ plan:   Information/referral to community resources:   Supervisors name and number.    [name]/FAMILY'Rivas RESPONSE TO PLAN OF CARE: CSW intern spoke with daughter [name]([name]) via the telephone. Daughter is not intrested in short term rehab. May want to seek long term care.CSW will follow up with daughter.    [name] GoldmannStephanie Rivas, [place]  [phone]    11/30/13  I have reviewed and concur with above assessment. CSW will follow up with [name] daughter re: d/c plan. [name], LCSWA [phone]

## 2013-12-01 NOTE — Progress Notes (Signed)
Patient BP 98/66. Spoke with Dr. Radonna RickerFeliz regarding bolus dose of Cardizem. Will not give 10Mg  Cardizem IV bolus. Cardizem drip started at 5mg /hr. Vital signs to be taken every 15 minutes for 1 hour then every 30 minutes x2. Will continue to monitor patient clinical status and vitals.

## 2013-12-01 NOTE — Progress Notes (Signed)
Patients HR 110 and BP 110/89. Cardizem drip still currently infusing at 5mg /hr. Rate will not be increased due to patients SBP not being >110. Will continue to monitor patient and vitals.

## 2013-12-01 NOTE — Progress Notes (Signed)
Cardizem drip has been infusing for 30 minutes. BP 91/65, HR 114. Rate not changed due to patient's BP and  HR. Cardizem drip still currently infusing at 5mg /hr Will continue frequent vitals and monitoring.

## 2013-12-01 NOTE — Progress Notes (Signed)
SUBJECTIVE:  Refusing to take her meds and spitting them out  OBJECTIVE:   Vitals:   Filed Vitals:   12/01/13 0055 12/01/13 0627 12/01/13 1003 12/01/13 1036  BP: 110/67 110/72 112/74 113/65  Pulse:  117 124 132  Temp:  98.1 F (36.7 C)  97.9 F (36.6 C)  TempSrc:  Oral  Oral  Resp:  17  18  Height:      Weight:  140 lb 3.4 oz (63.6 kg)    SpO2:  96%  96%   I&O's:   Intake/Output Summary (Last 24 hours) at 12/01/13 1103 Last data filed at 12/01/13 0900  Gross per 24 hour  Intake    480 ml  Output   1375 ml  Net   -895 ml   TELEMETRY: Reviewed telemetry pt in atrial fibrillation with RVR     PHYSICAL EXAM General: Well developed, well nourished, in no acute distress Head: Eyes PERRLA, No xanthomas.   Normal cephalic and atramatic  Lungs:   Crackles 1/2 way up bilaterally Heart:  Irregularly irregular and tachy S1 S2 Pulses are 2+ & equal. Abdomen: Bowel sounds are positive, abdomen soft and non-tender without masse Extremities:   No clubbing, cyanosis or edema.  DP +1 Neuro: Alert and oriented X 3. Psych:  Good affect, responds appropriately   LABS: Basic Metabolic Panel:  Recent Labs  16/10/96 1346 12/01/13 1015  NA 143 139  K 4.3 4.0  CL 109 99  CO2 21 29  GLUCOSE 110* 149*  BUN 17 16  CREATININE 0.74 0.95  CALCIUM 7.6* 8.7   Liver Function Tests:  Recent Labs  11/29/13 1346  AST 33  ALT 38*  ALKPHOS 65  BILITOT 0.5  PROT 5.1*  ALBUMIN 2.8*   No results found for this basename: LIPASE, AMYLASE,  in the last 72 hours CBC:  Recent Labs  11/29/13 1346  WBC 17.7*  NEUTROABS 13.8*  HGB 11.0*  HCT 35.1*  MCV 80.0  PLT 349   Cardiac Enzymes:  Recent Labs  11/29/13 1854 11/30/13 0021 11/30/13 0330  TROPONINI <0.30 <0.30 <0.30   BNP: No components found with this basename: POCBNP,  D-Dimer: No results found for this basename: DDIMER,  in the last 72 hours Hemoglobin A1C: No results found for this basename: HGBA1C,  in the last  72 hours Fasting Lipid Panel: No results found for this basename: CHOL, HDL, LDLCALC, TRIG, CHOLHDL, LDLDIRECT,  in the last 72 hours Thyroid Function Tests: No results found for this basename: TSH, T4TOTAL, FREET3, T3FREE, THYROIDAB,  in the last 72 hours Anemia Panel: No results found for this basename: VITAMINB12, FOLATE, FERRITIN, TIBC, IRON, RETICCTPCT,  in the last 72 hours Coag Panel:   Lab Results  Component Value Date   INR 0.99 10/27/2013   INR 2.4* 12/16/2008   INR 2.4* 12/15/2008    RADIOLOGY: Dg Chest 2 View  11/17/2013   CLINICAL DATA:  Followup pneumonia. Chest pain. Cough. Dyspnea. Coronary artery disease.  EXAM: CHEST  2 VIEW  COMPARISON:  10/27/2013  FINDINGS: Previously seen pulmonary infiltrates are no longer visualized. No evidence of congestive heart failure. Mild cardiomegaly stable. No evidence of pleural effusion.  There is a persistent asymmetric nodular density in the medial right upper lobe on the frontal projection. Small bronchogenic carcinoma cannot be excluded. Prior CABG again noted.  IMPRESSION: Interval clearing of previously seen pulmonary infiltrates.  Persistent asymmetric nodular density in the medial right upper lobe. Small bronchogenic carcinoma cannot be excluded. Chest  CT without contrast recommended for further evaluation.  These results will be called to the ordering clinician or representative by the Radiologist Assistant, and communication documented in the PACS Dashboard.   Electronically Signed   By: Myles RosenthalJohn  Stahl M.D.   On: 11/17/2013 14:14   Ct Chest Wo Contrast  11/23/2013   CLINICAL DATA:  Abnormal chest radiograph with right upper lobe density. Cough and shortness of breath.  EXAM: CT CHEST WITHOUT CONTRAST  TECHNIQUE: Multidetector CT imaging of the chest was performed following the standard protocol without IV contrast.  COMPARISON:  DG CHEST 2 VIEW dated 11/17/2013  FINDINGS: Lower right paratracheal lymph node measures 1.6 cm, nonspecific. Hilar  regions are difficult to definitively evaluate without IV contrast. No axillary adenopathy. Atherosclerotic calcification of the arterial vasculature. Heart is enlarged. Mitral annulus calcification. No pericardial effusion.  Mild biapical pleural parenchymal scarring. No pulmonary nodule to account for the questioned abnormality on recent chest radiograph. Respiratory motion somewhat degrades image quality. No pleural fluid. Airway is unremarkable.  Incidental imaging of the upper abdomen shows mild irregularity of the liver margin. No worrisome lytic or sclerotic lesions. Degenerative changes are seen in the spine.  IMPRESSION: 1. No pulmonary nodule to account for the questioned abnormality on recent chest radiograph. 2. No findings to explain the patient's cough and shortness of breath. 3. Irregularity of the liver margin is indicative of cirrhosis.   Electronically Signed   By: Leanna BattlesMelinda  Blietz M.D.   On: 11/23/2013 18:35   Ct Abdomen Pelvis W Contrast  11/30/2013   CLINICAL DATA:  80 year old female with lymphadenopathy. Initial encounter.  EXAM: CT ABDOMEN AND PELVIS WITH CONTRAST  TECHNIQUE: Multidetector CT imaging of the abdomen and pelvis was performed using the standard protocol following bolus administration of intravenous contrast.  CONTRAST:  100 mL Omnipaque 300.  COMPARISON:  Chest abdomen and pelvis CT 10/23/2013 and [facility]domen and Pelvis 05/30/2002.  FINDINGS: Small to moderate bilateral layering pleural effusions. Cardiomegaly. Biatrial enlargement. Calcified coronary artery atherosclerosis. No pericardial effusion. Bilateral lower lobe compressive atelectasis. Sequela of median sternotomy.  Stable visualized osseous structures. Degenerative changes in the spine and grade 1 lower lumbar spondylolisthesis.  Small to moderate presacral stranding. Trace pelvic free fluid. Foley catheter in the bladder which is decompressed. The rectum is unremarkable. Uterus is  surgically absent. Left adnexa stable since 2003 and within normal limits. Right adnexa not delineated.  Redundant sigmoid colon extending into the left upper quadrant. Occasional sigmoid and left colon diverticulosis. There is stranding along the left colon and left gutter (series 2, image 38). Intermittent mild motion artifact in the abdomen. Transverse colon has a more normal appearance. Right colon has a more normal appearance, although there is some dependent stranding at the right gutter is well.  Oral contrast has not yet reached the terminal ileum. No dilated small bowel. Negative stomach and duodenum.  Cholelithiasis. No pericholecystic inflammation. Somewhat heterogeneous appearance of the liver on the earliest contrast phase images, but homogeneous appearance on delayed images. Portal venous system within normal limits. Negative spleen, pancreas, adrenal glands, and kidneys. Major arterial structures in the abdomen and pelvis are patent. Aortoiliac calcified atherosclerosis noted.  Chronic hazy increased fat density at the root of the mesentery, was also present in 2003. Enlarged left retroperitoneal lymph nodes, individually up to 12 mm short axis (mildly regressed since 10/23/13 when this node was 19 mm short axis). No mesenteric lymphadenopathy today, with more prominent mesenteric nodes in 2003. No  pelvic or inguinal lymphadenopathy.  IMPRESSION: 1. Bilateral layering pleural effusions with atelectasis. Small volume pelvic free fluid, nonspecific. 2. Mildly regressed retroperitoneal lymphadenopathy in the abdomen. Has the patient been treated for lymphoproliferative disorder / lymphoma? 3. Nonspecific stranding in the presacral space and bilateral pericolic gutters. There is diverticulosis of the distal colon, but diverticulitis would not seem to explain the right pericolic gutter findings. Chronic haziness of the fat at the root of the mesentery has been present since 2003. 4. Cholelithiasis.  Advanced aortoiliac calcified atherosclerosis. Cardiomegaly.   Electronically Signed   By: Augusto Gamble M.D.   On: 11/30/2013 18:35   Dg Chest Port 1 View  11/29/2013   CLINICAL DATA:  Atrial fibrillation, shortness of breath, history hypertension, coronary artery disease, pulmonary hypertension  EXAM: PORTABLE CHEST - 1 VIEW  COMPARISON:  Portable exam 1402 hr compared to 11/17/2013  FINDINGS: Enlargement of cardiac silhouette post CABG.  Pulmonary vascular congestion.  Scattered interstitial infiltrates most likely representing mild pulmonary edema and CHF.  No segmental consolidation, pleural effusion or pneumothorax.  Bones demineralized.  IMPRESSION: Probable mild CHF.   Electronically Signed   By: Ulyses Southward M.D.   On: 11/29/2013 14:16   Problem List  1. Afib with RVR  2. CAD s/p CABG  3. Heart failure with preserved ejection fraction; mildly decompensated  4. Dementia, difficulty taking pills   Assessment and Plan:  80 y.o. female w/ PMHx significant for CAD s/p CABG 2006, h/o afib, HTN, dementia who presented to [facility] on 11/29/2013 from assisted living with complaints of rapid heart rate, found to be in afib with RVR.  Mildly fluid overloaded. Likely combination of afib with RVR with HFpEF which needs prolonged diastolic period for proper filling. IV lasix and then transition to PO.  Difficulty with PO meds is going to make outpatient management of her cardiac issues difficult. Discussed this with family. May benefit from liquid form of lopressor.  - continue diuretics - change to Lasix gtt today to try to promote better diuresis -  Metoprolol increased to 100mg  BID - will try to max out beta blocker first to minimize number of meds she is takingat home. If HR continues to remain high after increasing beta blocker may need to go back on IV Cardizem in the interim to keep HR controlled and prevent worsening CHF.  Once CHF resolves can address issue of her not taking meds.  If she  continues to refuse meds may need to consider Palliative Care.       Quintella Reichert, MD  12/01/2013  11:03 AM

## 2013-12-01 NOTE — Progress Notes (Signed)
[facility] PROGRESS NOTE Interim History: 80 y.o. female with prior h/o CAD s/p CABG, af ib not on anticoagulation because of frequent falls, pulm hypertension, DEMENTIA, was brought in from SNF for afib with RVR. Pt has moderate to severe dementia, and is a poor historian. Most of hisotry obtained from the daughters. As per the daughter she has severe dementia, doesn't take medications. Did not have any specific symptoms over the last few days. On arrival to ED, she was found to be in afib with rates in 150's, was started on cardizem drip and referred to hospitalist service for admission   Assessment/Plan: A-fib/Chronic diastolic heart failure/  Pulmonary HTN: - Diltiazem and metoprolol oral. She refused her medications, will try to simplify her regimen. - d/c diltiazem, increase metoprolol. Not a candidate for anticoagulation. - Change to IV lasix drip, strict I and O's. Daily weights. Mod UOP, + JVD. - Echo showed mild diastolic heart failure. - Weight 64.9->63.6 kg.  Leukocytosis:  - Unclear etiology. No source of infection found so far.  - UA neg for infection  - Blood cultures done and pending.  - Skin intact, peripheral smear pending to be review by pathologist, check CT abd and pelvis showed regression of lymphadenopathy.  Dementia - Mod to severe.  - Not on medications, as per the daughter, they have tried all the medications. - Increase Risperdal QHS, haldol prn.  HYPERTENSION: Stable.   Code Status:DNR  Family Communication: discussed with daughter at bedside  Disposition Plan: admitted to telemetry.     Consultants:  cardiology  Procedures:  CT abd and pelvis  CXR  Antibiotics:  None  HPI/Subjective: Relates her SOB is better. No complains. Has refused several oral rate controlling medications  Objective: Filed Vitals:   11/30/13 1949 11/30/13 2258 12/01/13 0055 12/01/13 0627  BP: 98/52 90/55 110/67 110/72  Pulse: 126 105  117  Temp: 98.6  F (37 C)   98.1 F (36.7 C)  TempSrc: Oral   Oral  Resp: 18   17  Height:      Weight:    63.6 kg (140 lb 3.4 oz)  SpO2: 94% 97%  96%    Intake/Output Summary (Last 24 hours) at 12/01/13 0958 Last data filed at 12/01/13 0200  Gross per 24 hour  Intake    240 ml  Output   1375 ml  Net  -1135 ml   Filed Weights   11/29/13 1700 11/30/13 0559 12/01/13 0627  Weight: 66 kg (145 lb 8.1 oz) 64.9 kg (143 lb 1.3 oz) 63.6 kg (140 lb 3.4 oz)    Exam:  General: Alert, awake, oriented x3, in no acute distress.  HEENT: No bruits, no goiter. + JVD Heart: Regular rate and rhythm, without murmurs, rubs, gallops.  Lungs: Good air movement, good air movement CTA B/L. Abdomen: Soft, nontender, nondistended, positive bowel sounds.   Data Reviewed: Basic Metabolic Panel:  Recent Labs Lab 11/29/13 1346  NA 143  K 4.3  CL 109  CO2 21  GLUCOSE 110*  BUN 17  CREATININE 0.74  CALCIUM 7.6*   Liver Function Tests:  Recent Labs Lab 11/29/13 1346  AST 33  ALT 38*  ALKPHOS 65  BILITOT 0.5  PROT 5.1*  ALBUMIN 2.8*   No results found for this basename: LIPASE, AMYLASE,  in the last 168 hours No results found for this basename: AMMONIA,  in the last 168 hours CBC:  Recent Labs Lab 11/29/13 1346  WBC 17.7*  NEUTROABS 13.8*  HGB  11.0*  HCT 35.1*  MCV 80.0  PLT 349   Cardiac Enzymes:  Recent Labs Lab 11/29/13 1346 11/29/13 1854 11/30/13 0021 11/30/13 0330  TROPONINI <0.30 <0.30 <0.30 <0.30   BNP (last 3 results)  Recent Labs  10/27/13 1750 11/29/13 1443  PROBNP 657.5* 2016.0*   CBG: No results found for this basename: GLUCAP,  in the last 168 hours  Recent Results (from the past 240 hour(s))  CULTURE, BLOOD (ROUTINE X 2)     Status: None   Collection Time    11/29/13  4:10 PM      Result Value Range Status   Specimen Description BLOOD RIGHT FOREARM   Final   Special Requests BOTTLES DRAWN AEROBIC AND ANAEROBIC 5CC   Final   Culture  Setup Time     Final    Value: 11/29/2013 22:16     Performed at [facility]   Culture     Final   Value:        BLOOD CULTURE RECEIVED NO GROWTH TO DATE CULTURE WILL BE HELD FOR 5 DAYS BEFORE ISSUING A FINAL NEGATIVE REPORT     Performed at [facility]   Report Status PENDING   Incomplete  CULTURE, BLOOD (ROUTINE X 2)     Status: None   Collection Time    11/29/13  4:20 PM      Result Value Range Status   Specimen Description BLOOD ARM LEFT   Final   Special Requests     Final   Value: BOTTLES DRAWN AEROBIC AND ANAEROBIC 10CCBLUE 5CCRED   Culture  Setup Time     Final   Value: 11/29/2013 22:15     Performed at [facility]   Culture     Final   Value:        BLOOD CULTURE RECEIVED NO GROWTH TO DATE CULTURE WILL BE HELD FOR 5 DAYS BEFORE ISSUING A FINAL NEGATIVE REPORT     Performed at [facility]   Report Status PENDING   Incomplete  MRSA PCR SCREENING     Status: None   Collection Time    11/29/13  7:51 PM      Result Value Range Status   MRSA by PCR NEGATIVE  NEGATIVE Final   Comment:            The GeneXpert MRSA Assay (FDA     approved for NASAL specimens     only), is one component of a     comprehensive MRSA colonization     surveillance program. It is not     intended to diagnose MRSA     infection nor to guide or     monitor treatment for     MRSA infections.     Studies: Ct Abdomen Pelvis W Contrast  11/30/2013   CLINICAL DATA:  80 year old female with lymphadenopathy. Initial encounter.  EXAM: CT ABDOMEN AND PELVIS WITH CONTRAST  TECHNIQUE: Multidetector CT imaging of the abdomen and pelvis was performed using the standard protocol following bolus administration of intravenous contrast.  CONTRAST:  100 mL Omnipaque 300.  COMPARISON:  Chest abdomen and pelvis CT 10/23/2013 and [facility] CT Abdomen and Pelvis 05/30/2002.  FINDINGS: Small to moderate bilateral layering pleural effusions. Cardiomegaly. Biatrial enlargement. Calcified coronary  artery atherosclerosis. No pericardial effusion. Bilateral lower lobe compressive atelectasis. Sequela of median sternotomy.  Stable visualized osseous structures. Degenerative changes in the spine and grade 1 lower lumbar spondylolisthesis.  Small to  moderate presacral stranding. Trace pelvic free fluid. Foley catheter in the bladder which is decompressed. The rectum is unremarkable. Uterus is surgically absent. Left adnexa stable since 2003 and within normal limits. Right adnexa not delineated.  Redundant sigmoid colon extending into the left upper quadrant. Occasional sigmoid and left colon diverticulosis. There is stranding along the left colon and left gutter (series 2, image 38). Intermittent mild motion artifact in the abdomen. Transverse colon has a more normal appearance. Right colon has a more normal appearance, although there is some dependent stranding at the right gutter is well.  Oral contrast has not yet reached the terminal ileum. No dilated small bowel. Negative stomach and duodenum.  Cholelithiasis. No pericholecystic inflammation. Somewhat heterogeneous appearance of the liver on the earliest contrast phase images, but homogeneous appearance on delayed images. Portal venous system within normal limits. Negative spleen, pancreas, adrenal glands, and kidneys. Major arterial structures in the abdomen and pelvis are patent. Aortoiliac calcified atherosclerosis noted.  Chronic hazy increased fat density at the root of the mesentery, was also present in 2003. Enlarged left retroperitoneal lymph nodes, individually up to 12 mm short axis (mildly regressed since 10/23/13 when this node was 19 mm short axis). No mesenteric lymphadenopathy today, with more prominent mesenteric nodes in 2003. No pelvic or inguinal lymphadenopathy.  IMPRESSION: 1. Bilateral layering pleural effusions with atelectasis. Small volume pelvic free fluid, nonspecific. 2. Mildly regressed retroperitoneal lymphadenopathy in the  abdomen. Has the patient been treated for lymphoproliferative disorder / lymphoma? 3. Nonspecific stranding in the presacral space and bilateral pericolic gutters. There is diverticulosis of the distal colon, but diverticulitis would not seem to explain the right pericolic gutter findings. Chronic haziness of the fat at the root of the mesentery has been present since 2003. 4. Cholelithiasis. Advanced aortoiliac calcified atherosclerosis. Cardiomegaly.   Electronically Signed   By: Augusto GambleLee  Hall M.D.   On: 11/30/2013 18:35   Dg Chest Port 1 View  11/29/2013   CLINICAL DATA:  Atrial fibrillation, shortness of breath, history hypertension, coronary artery disease, pulmonary hypertension  EXAM: PORTABLE CHEST - 1 VIEW  COMPARISON:  Portable exam 1402 hr compared to 11/17/2013  FINDINGS: Enlargement of cardiac silhouette post CABG.  Pulmonary vascular congestion.  Scattered interstitial infiltrates most likely representing mild pulmonary edema and CHF.  No segmental consolidation, pleural effusion or pneumothorax.  Bones demineralized.  IMPRESSION: Probable mild CHF.   Electronically Signed   By: Ulyses SouthwardMark  Boles M.D.   On: 11/29/2013 14:16    Scheduled Meds: . enoxaparin (LOVENOX) injection  40 mg Subcutaneous Q24H  . feeding supplement (ENSURE COMPLETE)  237 mL Oral BID BM  . furosemide  40 mg Intravenous Daily  . metoprolol tartrate  100 mg Oral BID  . risperiDONE  0.5 mg Oral QHS   Continuous Infusions:     Marinda ElkFELIZ ORTIZ, ABRAHAM  [facility] Pager [phone]. If 8PM-8AM, please contact night-coverage at [email or web address], password [email or web address] 12/01/2013, 9:58 AM  LOS: 2 days

## 2013-12-01 NOTE — Progress Notes (Signed)
CSW spoke extensively with patient's daughter Judeth CornfieldStephanie re: d/c plan. PT recommends SNF but daughter feels that this is not the right plan for her mother- will only make her "worse and more confused".  Daughter wants patient to return to [facility]. CSW spoke with Dottie at [facility]- they will agree to accept patient back when stable but was concerned that daughters need to have a good understanding of patient's condition and the limits of ALF care.  (Patient is now requiring 02 and is not taking her medications.) Dottie requested that a Palliative Care referral be considered to help family to understand her care needs. CSW contacted Dr. Radonna RickerFeliz and he agreed that this would be beneficial for patient. CSW will monitor and assist with move back to [facility] when medically stable per MD.  Lupita Leashonna T. West PughCrowder, LCSWA  [phone]

## 2013-12-02 DIAGNOSIS — I2789 Other specified pulmonary heart diseases: Secondary | ICD-10-CM

## 2013-12-02 LAB — BASIC METABOLIC PANEL
BUN: 14 mg/dL (ref 6–23)
CHLORIDE: 97 meq/L (ref 96–112)
CO2: 29 meq/L (ref 19–32)
CREATININE: 0.8 mg/dL (ref 0.50–1.10)
Calcium: 8.6 mg/dL (ref 8.4–10.5)
GFR calc Af Amer: 75 mL/min — ABNORMAL LOW (ref >90)
GFR calc non Af Amer: 64 mL/min — ABNORMAL LOW (ref >90)
Glucose, Bld: 160 mg/dL — ABNORMAL HIGH (ref 70–99)
POTASSIUM: 3.4 meq/L — AB (ref 3.7–5.3)
Sodium: 140 mEq/L (ref 137–147)

## 2013-12-02 MED ORDER — POTASSIUM CHLORIDE CRYS ER 20 MEQ PO TBCR
40.0000 meq | EXTENDED_RELEASE_TABLET | Freq: Two times a day (BID) | ORAL | Status: AC
  Administered 2013-12-02 (×2): 40 meq via ORAL
  Filled 2013-12-02 (×2): qty 2

## 2013-12-02 MED ORDER — DIGOXIN 0.25 MG/ML IJ SOLN: 0.1250 mg | Freq: Every day | INTRAMUSCULAR | Status: DC

## 2013-12-02 MED ORDER — METOPROLOL TARTRATE 50 MG PO TABS
50.0000 mg | ORAL_TABLET | Freq: Two times a day (BID) | ORAL | Status: DC
  Administered 2013-12-02: 50 mg via ORAL
  Filled 2013-12-02 (×3): qty 1

## 2013-12-02 MED ORDER — METOPROLOL TARTRATE 1 MG/ML IV SOLN
5.0000 mg | Freq: Four times a day (QID) | INTRAVENOUS | Status: DC | PRN
  Filled 2013-12-02: qty 5

## 2013-12-02 MED ORDER — DIGOXIN 0.25 MG/ML IJ SOLN
0.2500 mg | Freq: Once | INTRAMUSCULAR | Status: DC
  Filled 2013-12-02: qty 1

## 2013-12-02 MED ORDER — DIGOXIN 0.25 MG/ML IJ SOLN
0.1250 mg | Freq: Every day | INTRAMUSCULAR | Status: DC
  Filled 2013-12-02 (×2): qty 0.5

## 2013-12-02 MED ORDER — FUROSEMIDE 40 MG PO TABS: 40.0000 mg | ORAL_TABLET | Freq: Every day | ORAL | Status: DC

## 2013-12-02 MED ORDER — FUROSEMIDE 20 MG PO TABS
20.0000 mg | ORAL_TABLET | Freq: Every day | ORAL | Status: DC
  Filled 2013-12-02: qty 1

## 2013-12-02 MED ORDER — DIGOXIN 0.25 MG/ML IJ SOLN
0.2500 mg | Freq: Once | INTRAMUSCULAR | Status: AC
  Administered 2013-12-02: 0.25 mg via INTRAVENOUS
  Filled 2013-12-02: qty 1

## 2013-12-02 NOTE — Progress Notes (Signed)
[facility] PROGRESS NOTE Interim History: 80 y.o. female with prior h/o CAD s/p CABG, af ib not on anticoagulation because of frequent falls, pulm hypertension, DEMENTIA, was brought in from SNF for afib with RVR. Pt has moderate to severe dementia, and is a poor historian. Most of hisotry obtained from the daughters. As per the daughter she has severe dementia, doesn't take medications. Did not have any specific symptoms over the last few days. On arrival to ED, she was found to be in afib with rates in 150's, was started on cardizem drip and referred to hospitalist service for admission   Assessment/Plan: A-fib/Chronic diastolic heart failure/  Pulmonary HTN: - Metoprolol oral. Pt at time refused it. - Started diltiazem drip, but daughter insisted on drip to be stopped. Not a candidate for anticoagulation. - Change  IV lasix drip to oral, strict I and O's. Daily weights. good UOP, - JVD. - Echo showed mild diastolic heart failure. - Weight 64.9->63.6 -> 62 kg. - will need to d/w daughter the need to meet with PMT  Leukocytosis:  - Unclear etiology. No source of infection found so far. Will need to follow up with hematology. - UA neg for infection - Blood cultures done and pending.  - Skin intact, peripheral smear pending to be review by pathologist, check CT abd and pelvis showed regression of lymphadenopathy.  Dementia - Mod to severe.  - Not on medications, as per the daughter, they have tried all the medications. - Increase Risperdal QHS, haldol prn.  HYPERTENSION: Stable.   Code Status:DNR  Family Communication: discussed with daughter at bedside  Disposition Plan: admitted to telemetry.     Consultants:  cardiology  Procedures:  CT abd and pelvis  CXR  Antibiotics:  None  HPI/Subjective: No complains. Has refused several oral rate controlling medications  Objective: Filed Vitals:   12/01/13 2300 12/02/13 0000 12/02/13 0531 12/02/13 0902  BP:  103/65 93/57 83/59  90/48  Pulse:   120 120  Temp:   97.3 F (36.3 C)   TempSrc:   Oral   Resp:   16   Height:      Weight:   62.1 kg (136 lb 14.5 oz)   SpO2:   96% 97%    Intake/Output Summary (Last 24 hours) at 12/02/13 1032 Last data filed at 12/02/13 1006  Gross per 24 hour  Intake    657 ml  Output   3700 ml  Net  -3043 ml   Filed Weights   11/30/13 0559 12/01/13 0627 12/02/13 0531  Weight: 64.9 kg (143 lb 1.3 oz) 63.6 kg (140 lb 3.4 oz) 62.1 kg (136 lb 14.5 oz)    Exam:  General: Alert, awake, oriented x3, in no acute distress.  HEENT: No bruits, no goiter. - JVD Heart: Regular rate and rhythm, without murmurs, rubs, gallops.  Lungs: Good air movement, good air movement CTA B/L. Abdomen: Soft, nontender, nondistended, positive bowel sounds.   Data Reviewed: Basic Metabolic Panel:  Recent Labs Lab 11/29/13 1346 12/01/13 1015 12/02/13 0845  NA 143 139 140  K 4.3 4.0 3.4*  CL 109 99 97  CO2 21 29 29   GLUCOSE 110* 149* 160*  BUN 17 16 14   CREATININE 0.74 0.95 0.80  CALCIUM 7.6* 8.7 8.6   Liver Function Tests:  Recent Labs Lab 11/29/13 1346  AST 33  ALT 38*  ALKPHOS 65  BILITOT 0.5  PROT 5.1*  ALBUMIN 2.8*   No results found for this basename: LIPASE, AMYLASE,  in the last 168 hours No results found for this basename: AMMONIA,  in the last 168 hours CBC:  Recent Labs Lab 11/29/13 1346  WBC 17.7*  NEUTROABS 13.8*  HGB 11.0*  HCT 35.1*  MCV 80.0  PLT 349   Cardiac Enzymes:  Recent Labs Lab 11/29/13 1346 11/29/13 1854 11/30/13 0021 11/30/13 0330  TROPONINI <0.30 <0.30 <0.30 <0.30   BNP (last 3 results)  Recent Labs  10/27/13 1750 11/29/13 1443  PROBNP 657.5* 2016.0*   CBG: No results found for this basename: GLUCAP,  in the last 168 hours  Recent Results (from the past 240 hour(s))  CULTURE, BLOOD (ROUTINE X 2)     Status: None   Collection Time    11/29/13  4:10 PM      Result Value Range Status   Specimen Description  BLOOD RIGHT FOREARM   Final   Special Requests BOTTLES DRAWN AEROBIC AND ANAEROBIC 5CC   Final   Culture  Setup Time     Final   Value: 11/29/2013 22:16     Performed at [facility]   Culture     Final   Value:        BLOOD CULTURE RECEIVED NO GROWTH TO DATE CULTURE WILL BE HELD FOR 5 DAYS BEFORE ISSUING A FINAL NEGATIVE REPORT     Performed at [facility]   Report Status PENDING   Incomplete  CULTURE, BLOOD (ROUTINE X 2)     Status: None   Collection Time    11/29/13  4:20 PM      Result Value Range Status   Specimen Description BLOOD ARM LEFT   Final   Special Requests     Final   Value: BOTTLES DRAWN AEROBIC AND ANAEROBIC 10CCBLUE 5CCRED   Culture  Setup Time     Final   Value: 11/29/2013 22:15     Performed at [facility]   Culture     Final   Value:        BLOOD CULTURE RECEIVED NO GROWTH TO DATE CULTURE WILL BE HELD FOR 5 DAYS BEFORE ISSUING A FINAL NEGATIVE REPORT     Performed at [facility]   Report Status PENDING   Incomplete  MRSA PCR SCREENING     Status: None   Collection Time    11/29/13  7:51 PM      Result Value Range Status   MRSA by PCR NEGATIVE  NEGATIVE Final   Comment:            The GeneXpert MRSA Assay (FDA     approved for NASAL specimens     only), is one component of a     comprehensive MRSA colonization     surveillance program. It is not     intended to diagnose MRSA     infection nor to guide or     monitor treatment for     MRSA infections.     Studies: Ct Abdomen Pelvis W Contrast  11/30/2013   CLINICAL DATA:  80 year old female with lymphadenopathy. Initial encounter.  EXAM: CT ABDOMEN AND PELVIS WITH CONTRAST  TECHNIQUE: Multidetector CT imaging of the abdomen and pelvis was performed using the standard protocol following bolus administration of intravenous contrast.  CONTRAST:  100 mL Omnipaque 300.  COMPARISON:  Chest abdomen and pelvis CT 10/23/2013 and [facility] CT Abdomen and Pelvis  05/30/2002.  FINDINGS: Small to moderate bilateral layering pleural effusions. Cardiomegaly. Biatrial enlargement. Calcified coronary  artery atherosclerosis. No pericardial effusion. Bilateral lower lobe compressive atelectasis. Sequela of median sternotomy.  Stable visualized osseous structures. Degenerative changes in the spine and grade 1 lower lumbar spondylolisthesis.  Small to moderate presacral stranding. Trace pelvic free fluid. Foley catheter in the bladder which is decompressed. The rectum is unremarkable. Uterus is surgically absent. Left adnexa stable since 2003 and within normal limits. Right adnexa not delineated.  Redundant sigmoid colon extending into the left upper quadrant. Occasional sigmoid and left colon diverticulosis. There is stranding along the left colon and left gutter (series 2, image 38). Intermittent mild motion artifact in the abdomen. Transverse colon has a more normal appearance. Right colon has a more normal appearance, although there is some dependent stranding at the right gutter is well.  Oral contrast has not yet reached the terminal ileum. No dilated small bowel. Negative stomach and duodenum.  Cholelithiasis. No pericholecystic inflammation. Somewhat heterogeneous appearance of the liver on the earliest contrast phase images, but homogeneous appearance on delayed images. Portal venous system within normal limits. Negative spleen, pancreas, adrenal glands, and kidneys. Major arterial structures in the abdomen and pelvis are patent. Aortoiliac calcified atherosclerosis noted.  Chronic hazy increased fat density at the root of the mesentery, was also present in 2003. Enlarged left retroperitoneal lymph nodes, individually up to 12 mm short axis (mildly regressed since 10/23/13 when this node was 19 mm short axis). No mesenteric lymphadenopathy today, with more prominent mesenteric nodes in 2003. No pelvic or inguinal lymphadenopathy.  IMPRESSION: 1. Bilateral layering pleural  effusions with atelectasis. Small volume pelvic free fluid, nonspecific. 2. Mildly regressed retroperitoneal lymphadenopathy in the abdomen. Has the patient been treated for lymphoproliferative disorder / lymphoma? 3. Nonspecific stranding in the presacral space and bilateral pericolic gutters. There is diverticulosis of the distal colon, but diverticulitis would not seem to explain the right pericolic gutter findings. Chronic haziness of the fat at the root of the mesentery has been present since 2003. 4. Cholelithiasis. Advanced aortoiliac calcified atherosclerosis. Cardiomegaly.   Electronically Signed   By: Augusto Gamble M.D.   On: 11/30/2013 18:35    Scheduled Meds: . enoxaparin (LOVENOX) injection  40 mg Subcutaneous Q24H  . feeding supplement (ENSURE COMPLETE)  237 mL Oral BID BM  . metoprolol tartrate  100 mg Oral BID  . risperiDONE  0.5 mg Oral QHS   Continuous Infusions: . furosemide (LASIX) infusion 4 mg/hr (12/01/13 1049)     Marinda Elk  [facility] Pager [phone]. If 8PM-8AM, please contact night-coverage at [email or web address], password [facility] 12/02/2013, 10:32 AM  LOS: 3 days

## 2013-12-02 NOTE — Progress Notes (Addendum)
SUBJECTIVE:  The patient is somnolent, she still refuses her meds.   . enoxaparin (LOVENOX) injection  40 mg Subcutaneous Q24H  . feeding supplement (ENSURE COMPLETE)  237 mL Oral BID BM  . [START ON 12/03/2013] furosemide  20 mg Oral Daily  . metoprolol tartrate  100 mg Oral BID  . potassium chloride  40 mEq Oral BID  . risperiDONE  0.5 mg Oral QHS     OBJECTIVE:    Vitals:   Filed Vitals:   12/01/13 2300 12/02/13 0000 12/02/13 0531 12/02/13 0902  BP: 103/65 93/57 83/59  90/48  Pulse:   120 120  Temp:   97.3 F (36.3 C)   TempSrc:   Oral   Resp:   16   Height:      Weight:   136 lb 14.5 oz (62.1 kg)   SpO2:   96% 97%   I&O's:    Intake/Output Summary (Last 24 hours) at 12/02/13 1108 Last data filed at 12/02/13 1006  Gross per 24 hour  Intake    657 ml  Output   3700 ml  Net  -3043 ml   TELEMETRY: Reviewed telemetry pt in atrial fibrillation with RVR, HR up to 125 BPM  PHYSICAL EXAM General: Well developed, well nourished, in no acute distress Head: Eyes PERRLA, No xanthomas.   Normal cephalic and atramatic  Lungs:   Crackles at the bases bilaterally Heart:  Irregularly irregular and tachy S1 S2 Pulses are 2+ & equal. Abdomen: Bowel sounds are positive, abdomen soft and non-tender without masses Extremities:   No clubbing, cyanosis or edema.  DP +1 Neuro: Alert and oriented X 3. Psych:  Good affect, responds appropriately  LABS: Basic Metabolic Panel:  Recent Labs  16/07/9601/06/15 1015 12/02/13 0845  NA 139 140  K 4.0 3.4*  CL 99 97  CO2 29 29  GLUCOSE 149* 160*  BUN 16 14  CREATININE 0.95 0.80  CALCIUM 8.7 8.6   Liver Function Tests:  Recent Labs  11/29/13 1346  AST 33  ALT 38*  ALKPHOS 65  BILITOT 0.5  PROT 5.1*  ALBUMIN 2.8*    Recent Labs  11/29/13 1346  WBC 17.7*  NEUTROABS 13.8*  HGB 11.0*  HCT 35.1*  MCV 80.0  PLT 349   Cardiac Enzymes:  Recent Labs  11/29/13 1854 11/30/13 0021 11/30/13 0330  TROPONINI <0.30 <0.30 <0.30    Coag Panel:   Lab Results  Component Value Date   INR 0.99 10/27/2013   INR 2.4* 12/16/2008   INR 2.4* 12/15/2008   Ct Abdomen Pelvis W Contrast  11/30/2013   CLINICAL DATA:  80 year old female with lymphadenopathy. Initial encounter. IMPRESSION: 1. Bilateral layering pleural effusions with atelectasis. Small volume pelvic free fluid, nonspecific. 2. Mildly regressed retroperitoneal lymphadenopathy in the abdomen. Has the patient been treated for lymphoproliferative disorder / lymphoma? 3. Nonspecific stranding in the presacral space and bilateral pericolic gutters. There is diverticulosis of the distal colon, but diverticulitis would not seem to explain the right pericolic gutter findings. Chronic haziness of the fat at the root of the mesentery has been present since 2003. 4. Cholelithiasis. Advanced aortoiliac calcified atherosclerosis. Cardiomegaly.     Dg Chest Port 1 View 11/29/2013   IMPRESSION: Probable mild CHF.      Assessment and Plan:   Problem List  1. Afib with RVR  2. CAD s/p CABG  3. Heart failure with preserved ejection fraction; mildly decompensated  4. Dementia, difficulty taking pills   80 y.o. female  w/ PMHx significant for CAD s/p CABG 2006, h/o afib, HTN, dementia who presented to [facility] on 11/29/2013 from assisted living with complaints of rapid heart rate, found to be in afib with RVR.  Significantly fluid overloaded on admission. Likely combination of afib with RVR with HFpEF which needs prolonged diastolic period for proper filling. The patient has difficulty swallowing and refuses PO meds. Ok with crushed from.  She was hypotensive, significant diuresis in the last 24 hours. iv lasix was stopped, we would recommend to hold today, we will reevaluate in the am and if BP ok restart low dose PO lasix.   We will start iv Digoxin 0.35 mg today, 0.125 mg iv tomorrow, switch to PO at the discharge. Replace potassium.  Metoprolol was held due to hypotension. We  would recommend to give Metoprolol 5 mg po iv Q6H for now.    Tobias Alexander, Rexene Edison, MD  12/02/2013  11:08 AM

## 2013-12-03 DIAGNOSIS — I509 Heart failure, unspecified: Secondary | ICD-10-CM

## 2013-12-03 DIAGNOSIS — I5033 Acute on chronic diastolic (congestive) heart failure: Secondary | ICD-10-CM

## 2013-12-03 DIAGNOSIS — J449 Chronic obstructive pulmonary disease, unspecified: Secondary | ICD-10-CM

## 2013-12-03 DIAGNOSIS — I2789 Other specified pulmonary heart diseases: Secondary | ICD-10-CM

## 2013-12-03 DIAGNOSIS — I4891 Unspecified atrial fibrillation: Secondary | ICD-10-CM

## 2013-12-03 DIAGNOSIS — E43 Unspecified severe protein-calorie malnutrition: Secondary | ICD-10-CM

## 2013-12-03 DIAGNOSIS — Z515 Encounter for palliative care: Secondary | ICD-10-CM

## 2013-12-03 MED ORDER — CLONAZEPAM 0.5 MG PO TABS
1.0000 mg | ORAL_TABLET | Freq: Three times a day (TID) | ORAL | Status: DC | PRN
  Administered 2013-12-03 – 2013-12-04 (×3): 1 mg via ORAL
  Filled 2013-12-03 (×3): qty 2

## 2013-12-03 MED ORDER — METOPROLOL TARTRATE 100 MG PO TABS
100.0000 mg | ORAL_TABLET | Freq: Two times a day (BID) | ORAL | Status: DC
  Filled 2013-12-03 (×2): qty 1

## 2013-12-03 MED ORDER — METOPROLOL TARTRATE 50 MG PO TABS: 75.0000 mg | ORAL_TABLET | Freq: Two times a day (BID) | ORAL | Status: DC

## 2013-12-03 MED ORDER — DIGOXIN 0.25 MG/ML IJ SOLN
0.1250 mg | Freq: Every day | INTRAMUSCULAR | Status: DC
  Administered 2013-12-03: 0.125 mg via INTRAVENOUS
  Filled 2013-12-03 (×3): qty 0.5

## 2013-12-03 MED ORDER — METOPROLOL TARTRATE 25 MG PO TABS
25.0000 mg | ORAL_TABLET | Freq: Two times a day (BID) | ORAL | Status: DC
  Filled 2013-12-03 (×2): qty 1

## 2013-12-03 MED ORDER — METOPROLOL TARTRATE 50 MG PO TABS
50.0000 mg | ORAL_TABLET | Freq: Two times a day (BID) | ORAL | Status: DC
  Administered 2013-12-03 – 2013-12-04 (×3): 50 mg via ORAL
  Filled 2013-12-03 (×4): qty 1

## 2013-12-03 NOTE — Progress Notes (Signed)
Thank you for consulting the Palliative Medicine Team at [facility] to meet your patient's and family's needs.   The reason that you asked us to see your patient is  For GOC  We have scheduled your patient for a meeting: 2/8  330 pm  The Surrogate decision make is: Gerlene BurdockStephanie Glenn Contact information: [phone]  Other family members that need to be present:  [facility]    Your patient is able/unable to participate: unable  Additional Narrative:    Mieko Kneebone L. Ladona Ridgelaylor, MD MBA The Palliative Medicine Team at [facility] Team Phone: [phone] Pager: [phone]

## 2013-12-03 NOTE — Consult Note (Signed)
Patient [name]      DOB: [birth year]      JGG:[id number]     Consult Note from the Palliative Medicine Team at [facility] Requested by: Dr. Aileen Fass    PCP: Mathews Argyle, MD Reason for Consultation: [place]     Phone Number:[phone]  Assessment of patients Current state: 80 yr old white female with history of dementia, CAD, and afib with RVR . The patient has had a resent decline in her cognitive status.  She has been refusing to take her pills and has not been eating as well.  I met with patient's three daughters Colletta [place] is mainly in charge of her mom's medical care supported by her other two sisters.  The girls recognize that their mom has made a choice to not continue treatments that she is tired.  They want to honor her wish for a comfortable journey towards her time of death.  We talked about using hospice care in a hospice home preferentially.  They specifically stated they do not want to force her to take medications.  They would like to continue the iv digoxin for now to keep her comfortable.  We all agreed that in the am we will communicate with the social worker regarding transitions to hospice home.   Goals of Care: 1.  Code Status: DNR   2. Scope of Treatment: For now the girls would like to continue IV digoxin, and look into hospice home transition   4. Disposition: to be determined   3. Symptom Management:   1. Anxiety/Agitation: klonopin1 mg tid being used, patient is very somnlent at this time might want to go bid dosing.  2. Pain/dyspnea: not a current issue but if it becomes one could [place] roxanol   4. Psychosocial: three daughters. Lost spouse to colon cancer under hospice  5. Spiritual: Offered support as needed       Patient Documents Completed or Given: Document Given Completed  Advanced Directives Pkt    MOST    DNR    Gone from [facility]    Hard Choices      Brief HPI 80 yr old white female with dementia and  cardiac disease including afib with rvr. paitent refusing to take medications and not eating well. We were asked to assist with goals of care.   ROS: not able to communicate her needs    PMH:  Past Medical History  Diagnosis Date  . Cardiac dysrhythmia, unspecified   . Unspecified essential hypertension   . Other and unspecified hyperlipidemia   . Atrial fibrillation   . CAD (coronary artery disease)   . Complication of anesthesia     Takes a long time to wake up  . Dementia   . Pulmonary HTN 10/29/2013     PSH: Past Surgical History  Procedure Laterality Date  . Coronary artery bypass graft    . Abdominal hysterectomy    . Orthopedic surgery     I have reviewed the [place] and SH and  If appropriate update it with new information. No Known Allergies Scheduled Meds: . digoxin  0.125 mg Intravenous Daily  . enoxaparin (LOVENOX) injection  40 mg Subcutaneous Q24H  . feeding supplement (ENSURE COMPLETE)  237 mL Oral BID BM  . metoprolol tartrate  50 mg Oral BID  . risperiDONE  0.5 mg Oral QHS   Continuous Infusions:  PRN Meds:.acetaminophen, acetaminophen, clonazePAM, haloperidol lactate, meclizine, ondansetron (ZOFRAN) IV, ondansetron    BP  107/68  Pulse 107  Temp(Src) 97.8 F (36.6 C) (Oral)  Resp 18  Ht $R'5\' 2"'hz$  (1.575 m)  Wt 61.689 kg (136 lb)  BMI 24.87 kg/m2  SpO2 92%   PPS:30-40%   Intake/Output Summary (Last 24 hours) at 12/03/13 1920 Last data filed at 12/03/13 0900  Gross per 24 hour  Intake    480 ml  Output    525 ml  Net    -45 ml     Physical Exam:  General:sleeping soundly HEENT: perrl, eomi anicteric mmm dry, endentulous Chest:  Decreased but clear CVS: irregular s1,s2 Abdomen:soft not tender positive bowel sounds BCW:UGQB,VQ edema Neuro:somnolent  Labs: CBC    Component Value Date/Time   WBC 17.7* 11/29/2013 1346   RBC 4.39 11/29/2013 1346   HGB 11.0* 11/29/2013 1346   HCT 35.1* 11/29/2013 1346   PLT 349 11/29/2013 1346   MCV 80.0 11/29/2013  1346   MCH 25.1* 11/29/2013 1346   MCHC 31.3 11/29/2013 1346   RDW 14.9 11/29/2013 1346   LYMPHSABS 2.2 11/29/2013 1346   MONOABS 1.5* 11/29/2013 1346   EOSABS 0.2 11/29/2013 1346   BASOSABS 0.0 11/29/2013 1346       CMP     Component Value Date/Time   NA 140 12/02/2013 0845   K 3.4* 12/02/2013 0845   CL 97 12/02/2013 0845   CO2 29 12/02/2013 0845   GLUCOSE 160* 12/02/2013 0845   BUN 14 12/02/2013 0845   CREATININE 0.80 12/02/2013 0845   CALCIUM 8.6 12/02/2013 0845   PROT 5.1* 11/29/2013 1346   ALBUMIN 2.8* 11/29/2013 1346   AST 33 11/29/2013 1346   ALT 38* 11/29/2013 1346   ALKPHOS 65 11/29/2013 1346   BILITOT 0.5 11/29/2013 1346   GFRNONAA 64* 12/02/2013 0845   GFRAA 75* 12/02/2013 0845    Chest Xray Reviewed/Impressions: mild chf  Ct abd. Bilateral layering pleural effusions with atelectasis. Small  volume pelvic free fluid, nonspecific.  2. Mildly regressed retroperitoneal lymphadenopathy in the abdomen.  Has the patient been treated for lymphoproliferative disorder /  lymphoma?  3. Nonspecific stranding in the presacral space and bilateral  pericolic gutters. There is diverticulosis of the distal colon, but  diverticulitis would not seem to explain the right pericolic gutter  findings. Chronic haziness of the fat at the root of the mesentery  has been present since 2003.  4. Cholelithiasis. Advanced aortoiliac calcified atherosclerosis.  Cardiomegaly.     Time In Time Out Total Time Spent with Patient Total Overall Time  330  pm 515 pm 105 min 105 min    Greater than 50%  of this time was spent counseling and coordinating care related to the above assessment and plan.  Fredi Hurtado L. Lovena Le, MD MBA The Palliative Medicine Team at [facility] Phone: [phone] Pager: [phone]

## 2013-12-03 NOTE — Progress Notes (Signed)
SUBJECTIVE:  The patient is somnolent, she still refuses her meds.   . digoxin  0.125 mg Intravenous Daily  . enoxaparin (LOVENOX) injection  40 mg Subcutaneous Q24H  . feeding supplement (ENSURE COMPLETE)  237 mL Oral BID BM  . furosemide  20 mg Oral Daily  . metoprolol tartrate  50 mg Oral BID  . risperiDONE  0.5 mg Oral QHS     OBJECTIVE:    Vitals:   Filed Vitals:   12/02/13 1334 12/02/13 1345 12/02/13 2140 12/03/13 0640  BP: 110/62  88/58 99/68  Pulse: 138  98 88  Temp: 97.3 F (36.3 C)  98.1 F (36.7 C) 97.8 F (36.6 C)  TempSrc: Axillary  Oral Oral  Resp: 16  17 18   Height:      Weight:    136 lb (61.689 kg)  SpO2: 88% 93% 92% 94%   I&O's:    Intake/Output Summary (Last 24 hours) at 12/03/13 0836 Last data filed at 12/03/13 0600  Gross per 24 hour  Intake    360 ml  Output   1150 ml  Net   -790 ml   TELEMETRY: Reviewed telemetry pt in atrial fibrillation with RVR, HR up to 125 BPM  PHYSICAL EXAM General: Well developed, well nourished, in no acute distress Head: Eyes PERRLA, No xanthomas.   Normal cephalic and atramatic  Lungs:   Crackles at the bases bilaterally Heart:  Irregularly irregular and tachy S1 S2 Pulses are 2+ & equal. Abdomen: Bowel sounds are positive, abdomen soft and non-tender without masses Extremities:   No clubbing, cyanosis or edema.  DP +1 Neuro: Alert and oriented X 3. Psych:  Good affect, responds appropriately  LABS: Basic Metabolic Panel:  Recent Labs  08/65/78 1015 12/02/13 0845  NA 139 140  K 4.0 3.4*  CL 99 97  CO2 29 29  GLUCOSE 149* 160*  BUN 16 14  CREATININE 0.95 0.80  CALCIUM 8.7 8.6    Lab Results  Component Value Date   INR 0.99 10/27/2013   INR 2.4* 12/16/2008   INR 2.4* 12/15/2008   Ct Abdomen Pelvis W Contrast  11/30/2013   CLINICAL DATA:  80 year old female with lymphadenopathy. Initial encounter. IMPRESSION: 1. Bilateral layering pleural effusions with atelectasis. Small volume pelvic free fluid,  nonspecific. 2. Mildly regressed retroperitoneal lymphadenopathy in the abdomen. Has the patient been treated for lymphoproliferative disorder / lymphoma? 3. Nonspecific stranding in the presacral space and bilateral pericolic gutters. There is diverticulosis of the distal colon, but diverticulitis would not seem to explain the right pericolic gutter findings. Chronic haziness of the fat at the root of the mesentery has been present since 2003. 4. Cholelithiasis. Advanced aortoiliac calcified atherosclerosis. Cardiomegaly.     Dg Chest Port 1 View 11/29/2013   IMPRESSION: Probable mild CHF.      Assessment and Plan:   Problem List  1. Afib with RVR  2. CAD s/p CABG  3. Heart failure with preserved ejection fraction; mildly decompensated  4. Dementia, difficulty taking pills   80 y.o. female w/ PMHx significant for CAD s/p CABG 2006, h/o afib, HTN, dementia who presented to [facility] on 11/29/2013 from assisted living with complaints of rapid heart rate, found to be in afib with RVR.   1. Acute on chronic CHF with preserved LVEF, now almost euvolemic, we will hold lasix due to hypotension and also to allow [place] to use betablockers for rate control  2. A-fib with RVR - we will restart  PO matoprolol that needs to be crushed as she refuses to swallow them  Continue Digoxin   Tobias AlexanderNELSON, Destyne Goodreau, Rexene EdisonH, MD  12/03/2013  8:36 AM

## 2013-12-03 NOTE — Progress Notes (Addendum)
[facility] PROGRESS NOTE Interim History: 80 y.o. female with prior h/o CAD s/p CABG, af ib not on anticoagulation because of frequent falls, pulm hypertension, DEMENTIA, was brought in from SNF for afib with RVR. Pt has moderate to severe dementia, and is a poor historian. Most of hisotry obtained from the daughters. As per the daughter she has severe dementia, doesn't take medications. Did not have any specific symptoms over the last few days. On arrival to ED, she was found to be in afib with rates in 150's, was started on cardizem drip and referred to hospitalist service for admission   Assessment/Plan: A-fib/Chronic diastolic heart failure/  Pulmonary HTN: - Metoprolol oral. Pt at time refused it. Cannot get pt to take medication. - intermittently eating - Started diltiazem drip, but daughter insisted on drip to be stopped. Not a candidate for anticoagulation. - Change  IV lasix drip to oral, strict I and O's. Daily weights. good UOP, - JVD. - Echo showed mild diastolic heart failure. - Weight 64.9->63.6 -> 62 kg. - Daughter will like to meet with PMT before making a final descion.Cell phone [phone].   Leukocytosis:  - Unclear etiology. No source of infection found so far. Will need to follow up with hematology. - UA neg for infection - Blood cultures done and pending.  - Skin intact, peripheral smear pending to be review by pathologist, check CT abd and pelvis showed regression of lymphadenopathy.  Dementia - Mod to severe. Clonazepam and Haldol prn. - Not on medications, as per the daughter, they have tried all the medications. - Increase Risperdal QHS, haldol prn.  HYPERTENSION: Stable.   Code Status:DNR  Family Communication: discussed with daughter at bedside  Disposition Plan: admitted to telemetry.     Consultants:  cardiology  Procedures:  CT abd and pelvis  CXR  Antibiotics:  None  HPI/Subjective: Cont to refuse her  medications.  Objective: Filed Vitals:   12/02/13 1334 12/02/13 1345 12/02/13 2140 12/03/13 0640  BP: 110/62  88/58 99/68  Pulse: 138  98 88  Temp: 97.3 F (36.3 C)  98.1 F (36.7 C) 97.8 F (36.6 C)  TempSrc: Axillary  Oral Oral  Resp: 16  17 18   Height:      Weight:    61.689 kg (136 lb)  SpO2: 88% 93% 92% 94%    Intake/Output Summary (Last 24 hours) at 12/03/13 0934 Last data filed at 12/03/13 0854  Gross per 24 hour  Intake    600 ml  Output   1150 ml  Net   -550 ml   Filed Weights   12/01/13 0627 12/02/13 0531 12/03/13 0640  Weight: 63.6 kg (140 lb 3.4 oz) 62.1 kg (136 lb 14.5 oz) 61.689 kg (136 lb)    Exam:  General: Alert, awake, oriented x3, in no acute distress.  HEENT: No bruits, no goiter. - JVD Heart: Regular rate and rhythm, without murmurs, rubs, gallops.  Lungs: Good air movement, good air movement CTA B/L. Abdomen: Soft, nontender, nondistended, positive bowel sounds.   Data Reviewed: Basic Metabolic Panel:  Recent Labs Lab 11/29/13 1346 12/01/13 1015 12/02/13 0845  NA 143 139 140  K 4.3 4.0 3.4*  CL 109 99 97  CO2 21 29 29   GLUCOSE 110* 149* 160*  BUN 17 16 14   CREATININE 0.74 0.95 0.80  CALCIUM 7.6* 8.7 8.6   Liver Function Tests:  Recent Labs Lab 11/29/13 1346  AST 33  ALT 38*  ALKPHOS 65  BILITOT 0.5  PROT  5.1*  ALBUMIN 2.8*   No results found for this basename: LIPASE, AMYLASE,  in the last 168 hours No results found for this basename: AMMONIA,  in the last 168 hours CBC:  Recent Labs Lab 11/29/13 1346  WBC 17.7*  NEUTROABS 13.8*  HGB 11.0*  HCT 35.1*  MCV 80.0  PLT 349   Cardiac Enzymes:  Recent Labs Lab 11/29/13 1346 11/29/13 1854 11/30/13 0021 11/30/13 0330  TROPONINI <0.30 <0.30 <0.30 <0.30   BNP (last 3 results)  Recent Labs  10/27/13 1750 11/29/13 1443  PROBNP 657.5* 2016.0*   CBG: No results found for this basename: GLUCAP,  in the last 168 hours  Recent Results (from the past 240  hour(s))  CULTURE, BLOOD (ROUTINE X 2)     Status: None   Collection Time    11/29/13  4:10 PM      Result Value Range Status   Specimen Description BLOOD RIGHT FOREARM   Final   Special Requests BOTTLES DRAWN AEROBIC AND ANAEROBIC 5CC   Final   Culture  Setup Time     Final   Value: 11/29/2013 22:16     Performed at [facility]   Culture     Final   Value:        BLOOD CULTURE RECEIVED NO GROWTH TO DATE CULTURE WILL BE HELD FOR 5 DAYS BEFORE ISSUING A FINAL NEGATIVE REPORT     Performed at [facility]   Report Status PENDING   Incomplete  CULTURE, BLOOD (ROUTINE X 2)     Status: None   Collection Time    11/29/13  4:20 PM      Result Value Range Status   Specimen Description BLOOD ARM LEFT   Final   Special Requests     Final   Value: BOTTLES DRAWN AEROBIC AND ANAEROBIC 10CCBLUE 5CCRED   Culture  Setup Time     Final   Value: 11/29/2013 22:15     Performed at [facility]   Culture     Final   Value:        BLOOD CULTURE RECEIVED NO GROWTH TO DATE CULTURE WILL BE HELD FOR 5 DAYS BEFORE ISSUING A FINAL NEGATIVE REPORT     Performed at [facility]   Report Status PENDING   Incomplete  MRSA PCR SCREENING     Status: None   Collection Time    11/29/13  7:51 PM      Result Value Range Status   MRSA by PCR NEGATIVE  NEGATIVE Final   Comment:            The GeneXpert MRSA Assay (FDA     approved for NASAL specimens     only), is one component of a     comprehensive MRSA colonization     surveillance program. It is not     intended to diagnose MRSA     infection nor to guide or     monitor treatment for     MRSA infections.     Studies: No results found.  Scheduled Meds: . digoxin  0.125 mg Intravenous Daily  . enoxaparin (LOVENOX) injection  40 mg Subcutaneous Q24H  . feeding supplement (ENSURE COMPLETE)  237 mL Oral BID BM  . metoprolol tartrate  100 mg Oral BID  . risperiDONE  0.5 mg Oral QHS   Continuous Infusions:     Marinda Elk  [facility] Pager [phone]. If 8PM-8AM, please contact night-coverage  at [email or web address], password [email or web address] 12/03/2013, 9:34 AM  LOS: 4 days

## 2013-12-03 NOTE — Progress Notes (Signed)
PT Cancellation Note  Patient Details Name: [name] MRN: [id number] DOB: [birth year]   Cancelled Treatment:    Reason Eval/Treat Not Completed: Medical issues which prohibited therapy.  Attempted to see patient this am - restless and agitated.  Family present, trying to get patient to rest/sleep.  Will hold PT today and return tomorrow for PT session.   Vena AustriaDavis, Claus Silvestro H 12/03/2013, 10:47 AM Durenda HurtSusan H. Renaldo Fiddleravis, PT, [facility] Pager [phone]

## 2013-12-04 MED ORDER — MORPHINE SULFATE (CONCENTRATE) 20 MG/ML PO SOLN: 10.0000 mg | ORAL | Status: AC | PRN

## 2013-12-04 MED ORDER — CLONAZEPAM 1 MG PO TABS: 1.0000 mg | ORAL_TABLET | Freq: Three times a day (TID) | ORAL | Status: AC | PRN

## 2013-12-04 MED ORDER — DIGOXIN 125 MCG PO TABS
0.1250 mg | ORAL_TABLET | Freq: Every day | ORAL | Status: DC
  Administered 2013-12-04: 0.125 mg via ORAL
  Filled 2013-12-04: qty 1

## 2013-12-04 NOTE — Progress Notes (Signed)
Subjective: Eating breakfast. Appears to be in no distress. She is w/o complaints. Denies CP, palpitations and SOB.   Objective: Vital signs in last 24 hours: Temp:  [97.8 F (36.6 C)-98.7 F (37.1 C)] 97.8 F (36.6 C) (02/09 0422) Pulse Rate:  [98-109] 109 (02/09 0422) Resp:  [18] 18 (02/09 0422) BP: (107-126)/(66-89) 108/66 mmHg (02/09 0422) SpO2:  [90 %-92 %] 91 % (02/09 0422) Weight:  [137 lb 6.4 oz (62.324 kg)] 137 lb 6.4 oz (62.324 kg) (02/09 0422) Last BM Date: 12/01/13  Intake/Output from previous day: 02/08 0701 - 02/09 0700 In: 360 [P.O.:360] Out: 350 [Urine:350] Intake/Output this shift:    Medications Current Facility-Administered Medications  Medication Dose Route Frequency Provider Last Rate Last Dose  . acetaminophen (TYLENOL) tablet 650 mg  650 mg Oral Q6H PRN Hosie Poisson, MD   650 mg at 12/01/13 1849   Or  . acetaminophen (TYLENOL) suppository 650 mg  650 mg Rectal Q6H PRN Hosie Poisson, MD      . clonazePAM (KLONOPIN) tablet 1 mg  1 mg Oral TID PRN Charlynne Cousins, MD   1 mg at 12/03/13 1845  . digoxin (LANOXIN) tablet 0.125 mg  0.125 mg Oral Daily Charlynne Cousins, MD      . enoxaparin (LOVENOX) injection 40 mg  40 mg Subcutaneous Q24H Hosie Poisson, MD   40 mg at 12/03/13 1813  . feeding supplement (ENSURE COMPLETE) (ENSURE COMPLETE) liquid 237 mL  237 mL Oral BID BM Baird Lyons, RD   237 mL at 12/03/13 1027  . haloperidol lactate (HALDOL) injection 2 mg  2 mg Intravenous Q6H PRN Charlynne Cousins, MD   2 mg at 12/01/13 1454  . meclizine (ANTIVERT) tablet 25 mg  25 mg Oral QID PRN Hosie Poisson, MD   25 mg at 11/30/13 1715  . metoprolol (LOPRESSOR) tablet 50 mg  50 mg Oral BID Dorothy Spark, MD   50 mg at 12/03/13 2200  . ondansetron (ZOFRAN) tablet 4 mg  4 mg Oral Q6H PRN Hosie Poisson, MD       Or  . ondansetron (ZOFRAN) injection 4 mg  4 mg Intravenous Q6H PRN Hosie Poisson, MD      . risperiDONE (RISPERDAL) tablet 0.5 mg  0.5 mg Oral  QHS Charlynne Cousins, MD   0.5 mg at 12/03/13 2200    PE: General appearance: alert, cooperative and no distress Lungs: clear to auscultation bilaterally Heart: irregularly irregular rhythm and tachycardic Extremities: no LEE Pulses: 2+ and symmetric Skin: warm and dry Neurologic: Grossly normal  Lab Results:  No results found for this basename: WBC, HGB, HCT, PLT,  in the last 72 hours BMET  Recent Labs  12/01/13 1015 12/02/13 0845  NA 139 140  K 4.0 3.4*  CL 99 97  CO2 29 29  GLUCOSE 149* 160*  BUN 16 14  CREATININE 0.95 0.80  CALCIUM 8.7 8.6    Assessment/Plan  Active Problems:   HYPERTENSION   Atrial fibrillation   COPD, MILD   Dementia   Pulmonary HTN   Chronic diastolic heart failure   Acute on chronic diastolic CHF (congestive heart failure)   Atrial fibrillation with RVR   Malnutrition of moderate degree   Protein-calorie malnutrition, severe  Plan: Palliative care met with patient and family yesterday to discuss goals of care. Plan is to transition to home Hospice. "They specifically stated they do not want to force her to take medications. They would like to  continue the IV digoxin for now to keep her comfortable". She continues however to be in rapid Afib with ventricular rates fluctuating in the 140s. BP is stable and she is asymptomatic. She needs better rate control. Would also recommend continuing Metoprolol. Can crush tablets, as patient refuses to swallow them.      LOS: 5 days    Brittainy M. Ladoris Gene 12/04/2013 7:47 AM  Patient seen/examined.  I have amended note by B Simmons above to reflect my findings.  Patient breathing comfortably  Sleeping now. HR remains uncontrolled.  Agree with crushing tablets in food to allow her to get meds if not taking   Note plans for comfort measures  Patient DNR.    Dorris Carnes

## 2013-12-04 NOTE — Discharge Summary (Signed)
Physician Discharge Summary  [name] WJX:[id number] DOB: 02-Jan-1924 DOA: 11/29/2013  PCP: Ginette Otto, MD  Admit date: 11/29/2013 Discharge date: 12/04/2013  Time spent: 35 minutes  Recommendations for Outpatient Follow-up:  1. Follow up with hospice at ALF, once a bed available can go to residential hospice.  Discharge Diagnoses:  Active Problems:   HYPERTENSION   Atrial fibrillation   COPD, MILD   Dementia   Pulmonary HTN   Chronic diastolic heart failure   Acute on chronic diastolic CHF (congestive heart failure)   Atrial fibrillation with RVR   Malnutrition of moderate degree   Protein-calorie malnutrition, severe   Discharge Condition: stable  Diet recommendation: comfort feed  Filed Weights   12/02/13 0531 12/03/13 0640 12/04/13 0422  Weight: 62.1 kg (136 lb 14.5 oz) 61.689 kg (136 lb) 62.324 kg (137 lb 6.4 oz)    History of present illness:  80 y.o. female with prior h/o CAD s/p CABG, af ib not on anticoagulation because of frequent falls, pulm hypertension, DEMENTIA, was brought in from SNF for afib with RVR. Pt has moderate to severe dementia, and is a poor historian. Most of hisotry obtained from the daughters. As per the daughter she has severe dementia, doesn't take medications. Did not have any specific symptoms over the last few days. On arrival to ED, she was found to be in afib with rates in 150's, was started on cardizem drip and referred to hospitalist service for admission. Her cxr revealed mild chf.   Hospital Course:  A-fib/Chronic diastolic heart failure/ Pulmonary HTN:  - was started on a drip with improve HR, once transition to PO, her HR became uncontrolled. - due to pt not wanting to take her the medication. This was crush still the pt refused. - intermittently eating. She was diurese with IV lasix. - Not a candidate for anticoagulation.  - Echo showed mild diastolic heart failure.  - Weight 64.9->63.6 -> 62 kg.  - due to the pt non  compliance family wanted to meet with PMT - After meeting with PMT they decided to move toward comfort care. - residential hospice. - home on roxanol and clonazepam.  Leukocytosis:  - Unclear etiology. No source of infection found so far. Will need to follow up with hematology.  - UA neg for infection  - Blood cultures done and pending.  - Skin intact, peripheral smear pending to be review by pathologist, check CT abd and pelvis showed regression of lymphadenopathy.   Dementia  - Mod to severe. Clonazepam and Haldol prn.     Procedures:  CXR  Consultations:  PMT  Discharge Exam: Filed Vitals:   12/04/13 0422  BP: 108/66  Pulse: 109  Temp: 97.8 F (36.6 C)  Resp: 18    General: A&O x3 Cardiovascular: RRR Respiratory: good air movement CTA B/L  Discharge Instructions      Discharge Orders   Future Orders Complete By Expires   Diet - low sodium heart healthy  As directed    Increase activity slowly  As directed        Medication List    STOP taking these medications       meclizine 25 MG tablet  Commonly known as:  ANTIVERT     metoprolol tartrate 25 MG tablet  Commonly known as:  LOPRESSOR     ondansetron 4 MG disintegrating tablet  Commonly known as:  ZOFRAN ODT      TAKE these medications       clonazePAM  1 MG tablet  Commonly known as:  KLONOPIN  Take 1 tablet (1 mg total) by mouth 3 (three) times daily as needed (agitation).     morphine 20 MG/ML concentrated solution  Commonly known as:  ROXANOL  Take 0.5 mLs (10 mg total) by mouth every 2 (two) hours as needed for moderate pain, severe pain or shortness of breath.     triamcinolone cream 0.1 %  Commonly known as:  KENALOG  Apply 1 application topically daily as needed (to fave for eczema).       No Known Allergies Follow-up Information   Follow up with [facility] In 1 week. (hospital follow up for lymphadenopathy)        The results of significant diagnostics from  this hospitalization (including imaging, microbiology, ancillary and laboratory) are listed below for reference.    Significant Diagnostic Studies: Dg Chest 2 View  11/17/2013   CLINICAL DATA:  Followup pneumonia. Chest pain. Cough. Dyspnea. Coronary artery disease.  EXAM: CHEST  2 VIEW  COMPARISON:  10/27/2013  FINDINGS: Previously seen pulmonary infiltrates are no longer visualized. No evidence of congestive heart failure. Mild cardiomegaly stable. No evidence of pleural effusion.  There is a persistent asymmetric nodular density in the medial right upper lobe on the frontal projection. Small bronchogenic carcinoma cannot be excluded. Prior CABG again noted.  IMPRESSION: Interval clearing of previously seen pulmonary infiltrates.  Persistent asymmetric nodular density in the medial right upper lobe. Small bronchogenic carcinoma cannot be excluded. Chest CT without contrast recommended for further evaluation.  These results will be called to the ordering clinician or representative by the Radiologist Assistant, and communication documented in the PACS Dashboard.   Electronically Signed   By: Myles RosenthalJohn  Stahl M.D.   On: 11/17/2013 14:14   Ct Chest Wo Contrast  11/23/2013   CLINICAL DATA:  Abnormal chest radiograph with right upper lobe density. Cough and shortness of breath.  EXAM: CT CHEST WITHOUT CONTRAST  TECHNIQUE: Multidetector CT imaging of the chest was performed following the standard protocol without IV contrast.  COMPARISON:  DG CHEST 2 VIEW dated 11/17/2013  FINDINGS: Lower right paratracheal lymph node measures 1.6 cm, nonspecific. Hilar regions are difficult to definitively evaluate without IV contrast. No axillary adenopathy. Atherosclerotic calcification of the arterial vasculature. Heart is enlarged. Mitral annulus calcification. No pericardial effusion.  Mild biapical pleural parenchymal scarring. No pulmonary nodule to account for the questioned abnormality on recent chest radiograph. Respiratory  motion somewhat degrades image quality. No pleural fluid. Airway is unremarkable.  Incidental imaging of the upper abdomen shows mild irregularity of the liver margin. No worrisome lytic or sclerotic lesions. Degenerative changes are seen in the spine.  IMPRESSION: 1. No pulmonary nodule to account for the questioned abnormality on recent chest radiograph. 2. No findings to explain the patient's cough and shortness of breath. 3. Irregularity of the liver margin is indicative of cirrhosis.   Electronically Signed   By: Leanna BattlesMelinda  Blietz M.D.   On: 11/23/2013 18:35   Ct Abdomen Pelvis W Contrast  11/30/2013   CLINICAL DATA:  80 year old female with lymphadenopathy. Initial encounter.  EXAM: CT ABDOMEN AND PELVIS WITH CONTRAST  TECHNIQUE: Multidetector CT imaging of the abdomen and pelvis was performed using the standard protocol following bolus administration of intravenous contrast.  CONTRAST:  100 mL Omnipaque 300.  COMPARISON:  Chest abdomen and pelvis CT 10/23/2013 [facility]dom[facility]vis [facility]30[facility].  FINDINGS: Small to moderate bilateral layering pleural effusions. Cardiomegaly.  Biatrial enlargement. Calcified coronary artery atherosclerosis. No pericardial effusion. Bilateral lower lobe compressive atelectasis. Sequela of median sternotomy.  Stable visualized osseous structures. Degenerative changes in the spine and grade 1 lower lumbar spondylolisthesis.  Small to moderate presacral stranding. Trace pelvic free fluid. Foley catheter in the bladder which is decompressed. The rectum is unremarkable. Uterus is surgically absent. Left adnexa stable since 2003 and within normal limits. Right adnexa not delineated.  Redundant sigmoid colon extending into the left upper quadrant. Occasional sigmoid and left colon diverticulosis. There is stranding along the left colon and left gutter (series 2, image 38). Intermittent mild motion artifact in the abdomen. Transverse colon has a more  normal appearance. Right colon has a more normal appearance, although there is some dependent stranding at the right gutter is well.  Oral contrast has not yet reached the terminal ileum. No dilated small bowel. Negative stomach and duodenum.  Cholelithiasis. No pericholecystic inflammation. Somewhat heterogeneous appearance of the liver on the earliest contrast phase images, but homogeneous appearance on delayed images. Portal venous system within normal limits. Negative spleen, pancreas, adrenal glands, and kidneys. Major arterial structures in the abdomen and pelvis are patent. Aortoiliac calcified atherosclerosis noted.  Chronic hazy increased fat density at the root of the mesentery, was also present in 2003. Enlarged left retroperitoneal lymph nodes, individually up to 12 mm short axis (mildly regressed since 10/23/13 when this node was 19 mm short axis). No mesenteric lymphadenopathy today, with more prominent mesenteric nodes in 2003. No pelvic or inguinal lymphadenopathy.  IMPRESSION: 1. Bilateral layering pleural effusions with atelectasis. Small volume pelvic free fluid, nonspecific. 2. Mildly regressed retroperitoneal lymphadenopathy in the abdomen. Has the patient been treated for lymphoproliferative disorder / lymphoma? 3. Nonspecific stranding in the presacral space and bilateral pericolic gutters. There is diverticulosis of the distal colon, but diverticulitis would not seem to explain the right pericolic gutter findings. Chronic haziness of the fat at the root of the mesentery has been present since 2003. 4. Cholelithiasis. Advanced aortoiliac calcified atherosclerosis. Cardiomegaly.   Electronically Signed   By: Augusto Gamble M.D.   On: 11/30/2013 18:35   Dg Chest Port 1 View  11/29/2013   CLINICAL DATA:  Atrial fibrillation, shortness of breath, history hypertension, coronary artery disease, pulmonary hypertension  EXAM: PORTABLE CHEST - 1 VIEW  COMPARISON:  Portable exam 1402 hr compared to  11/17/2013  FINDINGS: Enlargement of cardiac silhouette post CABG.  Pulmonary vascular congestion.  Scattered interstitial infiltrates most likely representing mild pulmonary edema and CHF.  No segmental consolidation, pleural effusion or pneumothorax.  Bones demineralized.  IMPRESSION: Probable mild CHF.   Electronically Signed   By: Ulyses Southward M.D.   On: 11/29/2013 14:16    Microbiology: Recent Results (from the past 240 hour(s))  CULTURE, BLOOD (ROUTINE X 2)     Status: None   Collection Time    11/29/13  4:10 PM      Result Value Range Status   Specimen Description BLOOD RIGHT FOREARM   Final   Special Requests BOTTLES DRAWN AEROBIC AND ANAEROBIC 5CC   Final   Culture  Setup Time     Final   Value: 11/29/2013 22:16     Performed at [facility]   Culture     Final   Value:        BLOOD CULTURE RECEIVED NO GROWTH TO DATE CULTURE WILL BE HELD FOR 5 DAYS BEFORE ISSUING A FINAL NEGATIVE REPORT     Performed at [facility]   Report Status PENDING   Incomplete  CULTURE, BLOOD (ROUTINE X 2)     Status: None   Collection Time    11/29/13  4:20 PM      Result Value Range Status   Specimen Description BLOOD ARM LEFT   Final   Special Requests     Final   Value: BOTTLES DRAWN AEROBIC AND ANAEROBIC 10CCBLUE 5CCRED   Culture  Setup Time     Final   Value: 11/29/2013 22:15     Performed at [facility]   Culture     Final   Value:        BLOOD CULTURE RECEIVED NO GROWTH TO DATE CULTURE WILL BE HELD FOR 5 DAYS BEFORE ISSUING A FINAL NEGATIVE REPORT     Performed at [facility]   Report Status PENDING   Incomplete  MRSA PCR SCREENING     Status: None   Collection Time    11/29/13  7:51 PM      Result Value Range Status   MRSA by PCR NEGATIVE  NEGATIVE Final   Comment:            The GeneXpert MRSA Assay (FDA     approved for NASAL specimens     only), is one component of a     comprehensive MRSA colonization     surveillance program. It is not      intended to diagnose MRSA     infection nor to guide or     monitor treatment for     MRSA infections.     Labs: Basic Metabolic Panel:  Recent Labs Lab 11/29/13 1346 12/01/13 1015 12/02/13 0845  NA 143 139 140  K 4.3 4.0 3.4*  CL 109 99 97  CO2 21 29 29   GLUCOSE 110* 149* 160*  BUN 17 16 14   CREATININE 0.74 0.95 0.80  CALCIUM 7.6* 8.7 8.6   Liver Function Tests:  Recent Labs Lab 11/29/13 1346  AST 33  ALT 38*  ALKPHOS 65  BILITOT 0.5  PROT 5.1*  ALBUMIN 2.8*   No results found for this basename: LIPASE, AMYLASE,  in the last 168 hours No results found for this basename: AMMONIA,  in the last 168 hours CBC:  Recent Labs Lab 11/29/13 1346  WBC 17.7*  NEUTROABS 13.8*  HGB 11.0*  HCT 35.1*  MCV 80.0  PLT 349   Cardiac Enzymes:  Recent Labs Lab 11/29/13 1346 11/29/13 1854 11/30/13 0021 11/30/13 0330  TROPONINI <0.30 <0.30 <0.30 <0.30   BNP: BNP (last 3 results)  Recent Labs  10/27/13 1750 11/29/13 1443  PROBNP 657.5* 2016.0*   CBG: No results found for this basename: GLUCAP,  in the last 168 hours     Signed:  Marinda Elk  [facility] 12/04/2013, 8:14 AM

## 2013-12-04 NOTE — Progress Notes (Addendum)
Physical Therapy Treatment Patient Details Name: [name] SOBOTTA MRN: [id number] DOB: 11/19/1923 Today's Date: 12/04/2013 Time: 7829-5621 PT Time Calculation (min): 25 min  PT Assessment / Plan / Recommendation  History of Present Illness 80 yo female admitted with afib, cad and confusion.  Pt has been in SNF for rehab.    PT Comments   Pt admitted with above. Pt currently with functional limitations due to balance and endurance deficits.  Noted that pt is comfort care and for residential Hospice.  Assume that family still desires pt to be mobilized therefore will continue to work with pt unless MD notifies PT otherwise.  Need to verify with family and medical team if pt is going home with HOspice versus to [facility] as pt may have equipment needs if she goes home.  MD:  Please advise PT as to above information.   Pt will benefit from skilled PT to increase their independence and safety with mobility to allow discharge to the venue listed below.  Follow Up Recommendations  SNF;Supervision/Assistance - 24 hour                 Equipment Recommendations  Other (comment) (TBA)        Frequency Min 2X/week   Progress towards PT Goals Progress towards PT goals: Progressing toward goals  Plan Current plan remains appropriate    Precautions / Restrictions Precautions Precautions: Fall Restrictions Weight Bearing Restrictions: No   Pertinent Vitals/Pain HR 125-165 bpm with activity.  No pain    Mobility  Bed Mobility Overal bed mobility: Needs Assistance;+ 2 for safety/equipment Bed Mobility: Supine to Sit Supine to sit: +2 for physical assistance;Mod assist;HOB elevated General bed mobility comments: Pt needed assist to initiate movement.  Pt needed assist to move LEs off bed as well as elevation of trunk.   Transfers Overall transfer level: Needs assistance Equipment used: 2 person hand held assist Transfers: Sit to/from Stand Sit to Stand: Mod assist;+2 safety/equipment General  transfer comment: Pt needed steadying asssit secondary to posterior lean.   Ambulation/Gait Ambulation/Gait assistance: Mod assist;+2 safety/equipment Ambulation Distance (Feet): 45 Feet Assistive device: Rolling walker (2 wheeled);2 person hand held assist Gait Pattern/deviations: Step-to pattern;Decreased stride length;Decreased step length - right;Decreased step length - left;Shuffle;Narrow base of support;Trunk flexed Gait velocity interpretation: <1.8 ft/sec, indicative of risk for recurrent falls General Gait Details: Pt ambulates with somewhat flexed trunk and knees.  Pt needs steadying assist throughout at times losing balance posteriorly.  Pt definitely needing +2 HHA or RW to ambulate.  Pt generally unsteady.   Pt actually seems to do better with HHA of 2 as she cannot maneuver Rw without mod assist.     Exercises General Exercises - Lower Extremity Ankle Circles/Pumps: AROM;Both;10 reps;Seated Quad Sets: AROM;Both;5 reps;Seated   PT Goals (current goals can now be found in the care plan section)    Visit Information  Last PT Received On: 12/04/13 Assistance Needed: +2 (for O2 and safety) History of Present Illness: 80 yo female admitted with afib, cad and confusion.  Pt has been in SNF for rehab.     Subjective Data  Subjective: "I try to walk. "   Cognition  Cognition Arousal/Alertness: Awake/alert Behavior During Therapy: Flat affect Overall Cognitive Status: History of cognitive impairments - at baseline Area of Impairment: Orientation;Memory;Following commands;Safety/judgement;Awareness;Problem solving Orientation Level: Disoriented to;Time;Situation;Place Memory: Decreased short-term memory Following Commands: Follows one step commands inconsistently;Follows one step commands with increased time Safety/Judgement: Decreased awareness of safety;Decreased awareness of deficits Awareness:  Intellectual Problem Solving: Decreased initiation;Difficulty sequencing;Requires  verbal cues;Requires tactile cues General Comments: Intiially sleeping but able to arouse pt and pt stayed awake the entire session.      Balance  Balance Overall balance assessment: Needs assistance;History of Falls Sitting-balance support: Bilateral upper extremity supported;Feet supported Sitting balance-Leahy Scale: Fair Sitting balance - Comments: Leans posteriorly Postural control: Posterior lean Standing balance support: Bilateral upper extremity supported;During functional activity Standing balance-Leahy Scale: Poor Standing balance comment: Needs assist for balance High level balance activites: Turns;Sudden stops;Direction changes High Level Balance Comments: Needs mod asssit with high level activities  End of Session PT - End of Session Equipment Utilized During Treatment: Gait belt Activity Tolerance: Patient limited by fatigue Patient left: in chair;with call bell/phone within reach Nurse Communication: Mobility status        Rivas,[name] 12/04/2013, 10:30 AM [name],PT Acute Rehabilitation [phone] (pager)

## 2013-12-04 NOTE — Progress Notes (Signed)
Patient NW:GNFA:Devlynn Glenetta HewS Eve      DOB: [birth year]      OZH:[id number]   [facility]    Subjective: Patient awake.  Sitter stated that she rested well over night.  Patient able to answer some questions, then closes eyes. Discussed case with Dr. David StallFeliz Ortiz  Filed Vitals:   12/04/13 0422  BP: 108/66  Pulse: 109  Temp: 97.8 F (36.6 C)  Resp: 18   Physical exam:  General: No acute distress PERRL, EOMI anciteric, mm dry Chest decreased but clear CVS: irregular, S1, s2 Abd: soft, not tender not distended Ext: warm, no edema Neuro: hard of hearing , pleasantly confused but can still get off a good sarcastic comment.  Lab Results  Component Value Date   CREATININE 0.80 12/02/2013   BUN 14 12/02/2013   NA 140 12/02/2013   K 3.4* 12/02/2013   CL 97 12/02/2013   CO2 29 12/02/2013   Lab Results  Component Value Date   WBC 17.7* 11/29/2013   HGB 11.0* 11/29/2013   HCT 35.1* 11/29/2013   MCV 80.0 11/29/2013   PLT 349 11/29/2013    Assessment and plan: 80 yr old white female with afib rvr, moderate dementia, failure to thrive.  Family would like to pursue comfort care, they do not want to force her to take oral medications or eat.  The have a completed MOST form which reflects this per Dtr. Stepahnie.  They have requested hospice home referral.  1.  DNR  2. Afib: please continue iv dig and metoprolol while in hospital.  Family understands they won't continue at hospice. They dont' want to force her to take pills.  3.  Treat pain and dyspnea when needed.  Can use Roxanol 20 mg/ml 5 ml q 2 hrs prn dispense 30 ml  4. Anxiety : using Klonipin which she has let them give her. Could continue at discharge.  5. Comfort feed.   15 min .   Rasheen Schewe L. Ladona Ridgelaylor, MD MBA The Palliative Medicine Team at [facility] Team Phone: [phone] Pager: [phone]

## 2013-12-04 NOTE — Progress Notes (Signed)
UR completed Ugochukwu Chichester K. Kamel Haven, RN, BSN, MSHL, CCM  12/04/2013 5:56 PM

## 2013-12-04 NOTE — Consult Note (Signed)
[facility] Liaison: [facility] follow up with patient's daughter. Spoke with daughter [name] by phone to acknowledge her request. Made her aware of no availability today. Received request from CSW for availability today. Made her aware no availability today. Will update family and CSW if availability changes for tomorrow. Thank you. Forrestine Himva Dalaney Needle LCSW [phone]

## 2013-12-04 NOTE — Care Management Note (Addendum)
  Page 1 of 1   12/04/2013     6:00:09 PM   CARE MANAGEMENT NOTE 12/04/2013  Patient:  Arbutus PedGIPPLE,Sinahi S   Account Number:  [id number]  Date Initiated:  12/04/2013  Documentation initiated by:  Kimbree Casanas  Subjective/Objective Assessment:   admitted with atrial fib     Action/Plan:   monitor for dispositon needs   Anticipated DC Date:  12/04/2013   [facility]  In-house referral  Clinical Social Worker         Choice offered to / List presented to:             Status of service:  Completed, signed off Medicare Important Message given?   (If response is "NO", the following Medicare IM given date fields will be blank) Date Medicare IM given:   Date Additional Medicare IM given:    Discharge Disposition:  HOSPICE MEDICAL FACILITY  Per UR Regulation:  Reviewed for med. necessity/level of care/duration of stay  If discussed at Long Length of Stay Meetings, dates discussed:    Comments:  12/04/2013 d/c to [facility]. Briza Bark RN, BSN, [place], [place] 12/04/2013

## 2013-12-05 LAB — CULTURE, BLOOD (ROUTINE X 2)
CULTURE: NO GROWTH
Culture: NO GROWTH

## 2013-12-05 NOTE — Progress Notes (Signed)
Per recommendation of Dr. Ladona Ridgelaylor- Palliative Care and discussion with patient's daughter Judeth CornfieldStephanie- patient referred for residential hospice placement.  Discussed with daughter [facility] choices and [facility]  Patient is in agreement with plan.  Notified Forrestine HimEva Davis, LCSW- [facility] and [facility].  Lorri Frederickonna T. [place] PughCrowder, LCSWA  [phone]

## 2013-12-05 NOTE — Progress Notes (Signed)
[facility]; [facility] was [facility] and [facility] to residential hospice home. CSW facilitated d/c with patient, 2 daughters, and Hospice RN.  EMS arranged and nursing  notified and report called.  Patient and daughters were pleased with d//c plan.  CSW signing off.  Lorri Frederickonna T. [place] PughCrowder, LCSWA  [phone]

## 2013-12-24 DEATH — deceased

## 2015-08-10 IMAGING — CT CT HEAD W/O CM
2 of 4 series · 16 of 30 positions shown, 19 images · non-contrast
Comparison: 11/10/2011

CLINICAL DATA: Altered mental status.  Confusion.

EXAM:
CT HEAD WITHOUT CONTRAST
TECHNIQUE: Contiguous axial images were obtained from the base of the skull
through the vertex without intravenous contrast.

[Series 2: head w/o · axial · non-contrast · 0.48mm/px · z∈[-174,-54]mm · 9 of 31 slices shown, 12 images]
[im 4/31  brain]
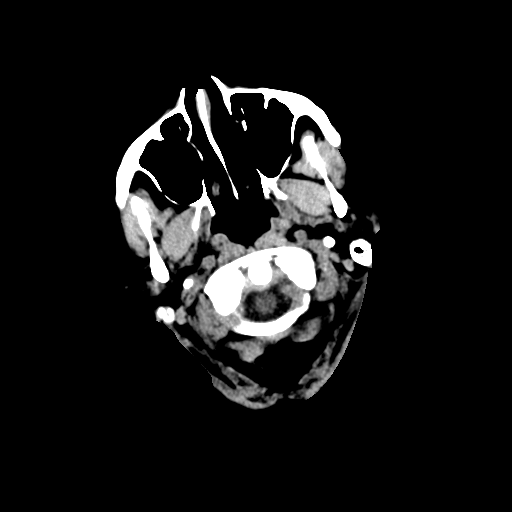
[im 4/31  bone]
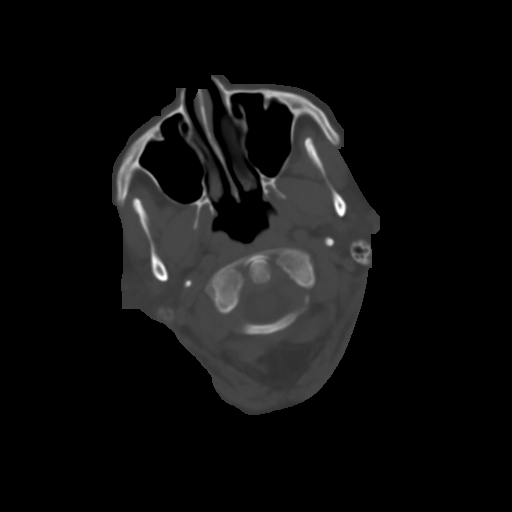
[im 7/31  brain]
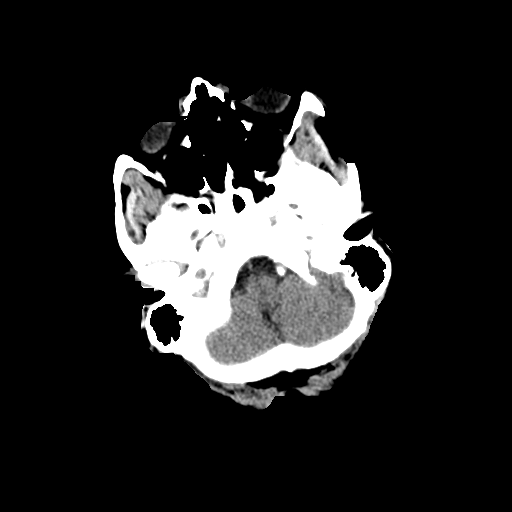
[im 10/31  brain]
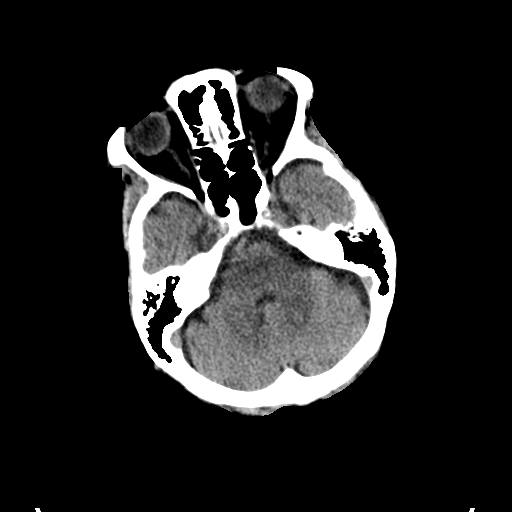
[im 13/31  brain]
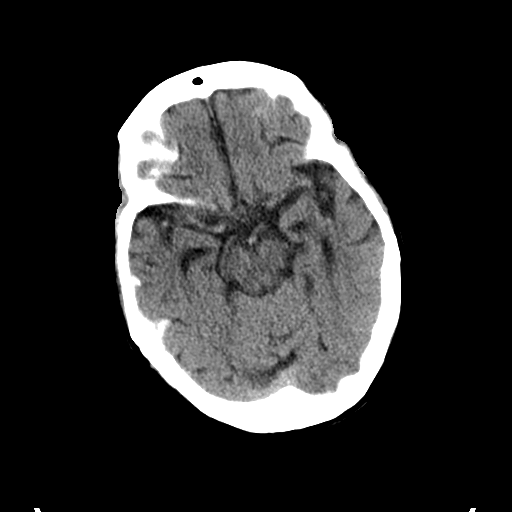
[im 16/31  brain]
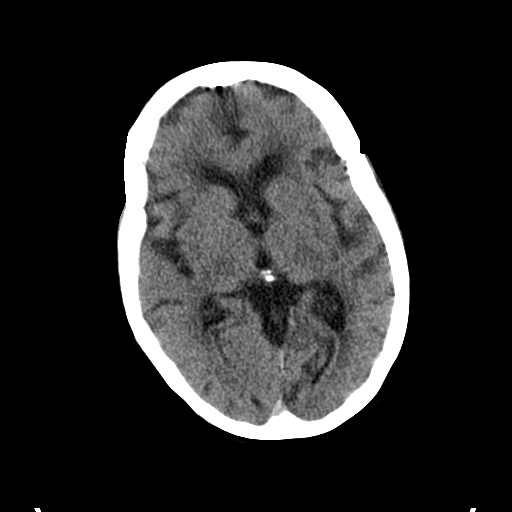
[im 16/31  bone]
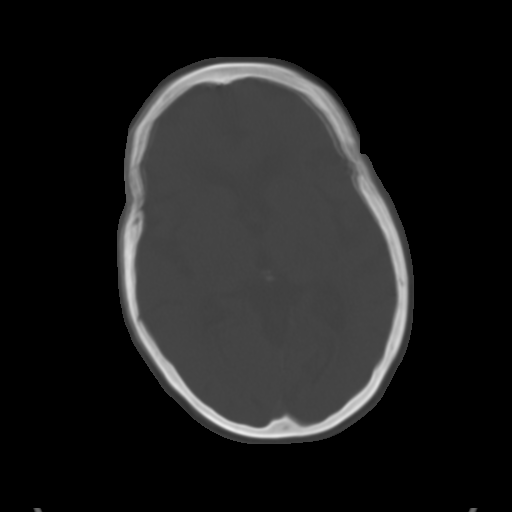
[im 19/31  brain]
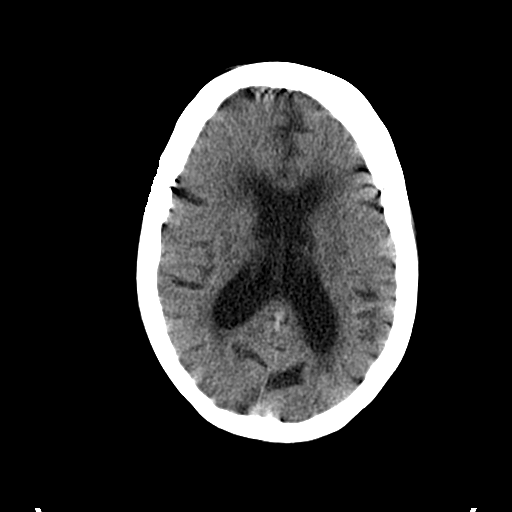
[im 22/31  brain]
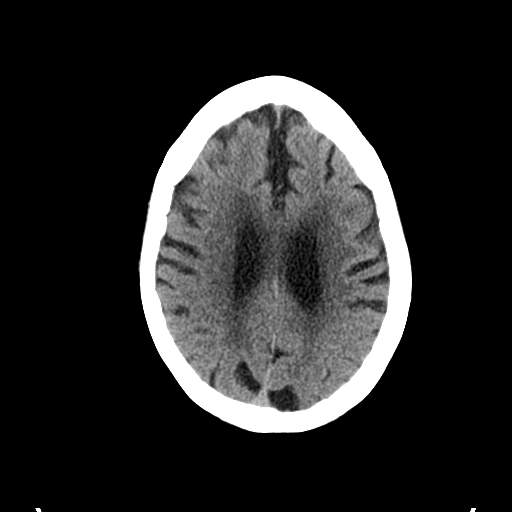
[im 25/31  brain]
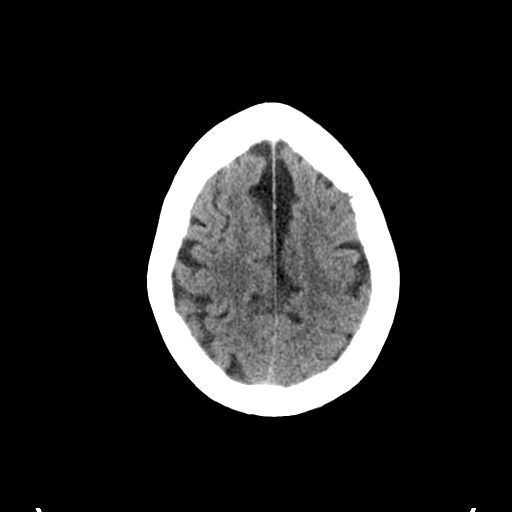
[im 28/31  brain]
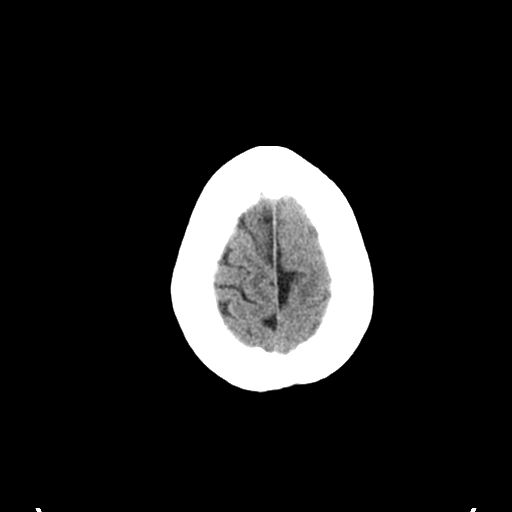
[im 28/31  bone]
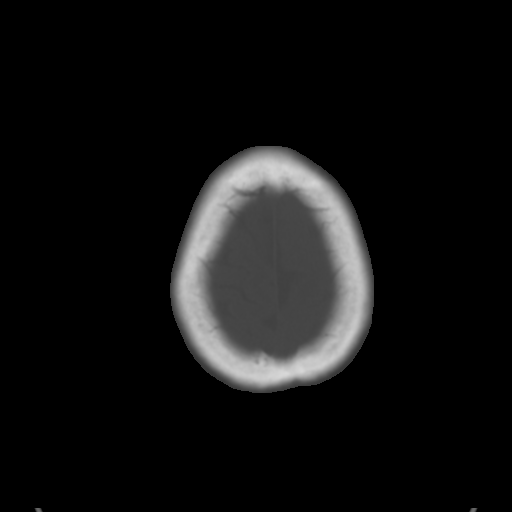

[Series 3: bone windows · axial · 0.48mm/px · z∈[-174,-69]mm · 7 of 31 slices shown]
[im 4/31  bone]
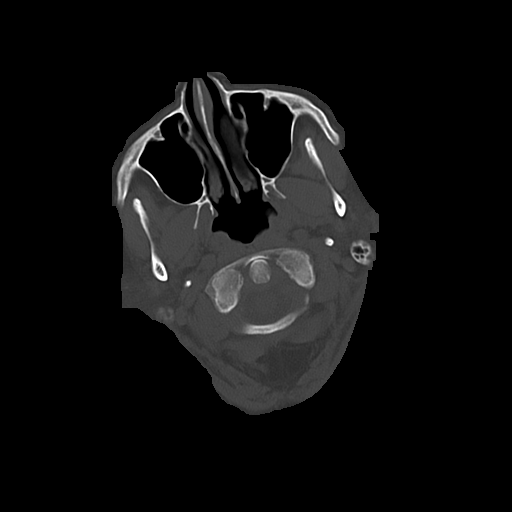
[im 7/31  bone]
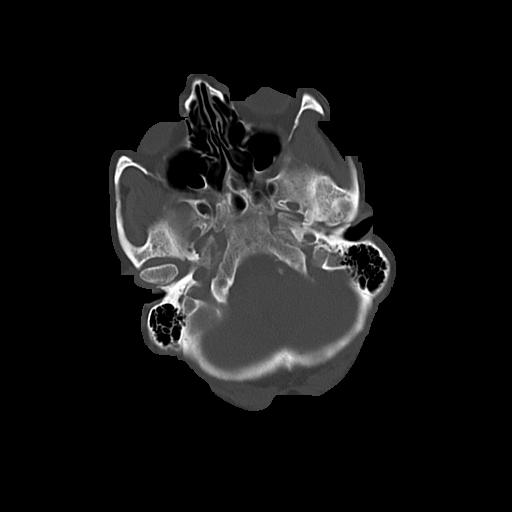
[im 10/31  bone]
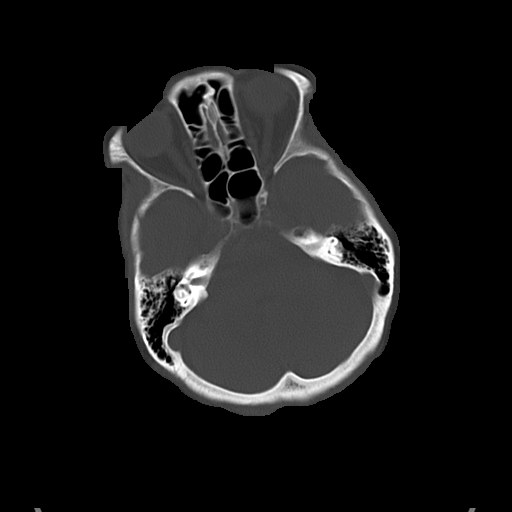
[im 13/31  bone]
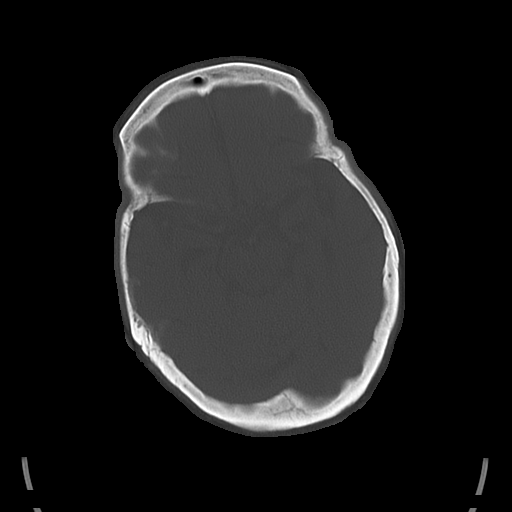
[im 19/31  bone]
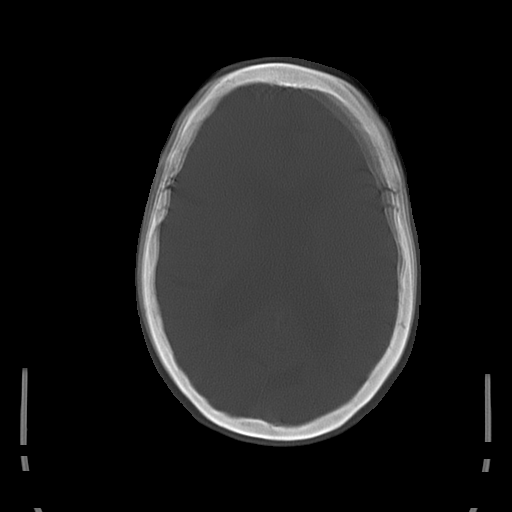
[im 22/31  bone]
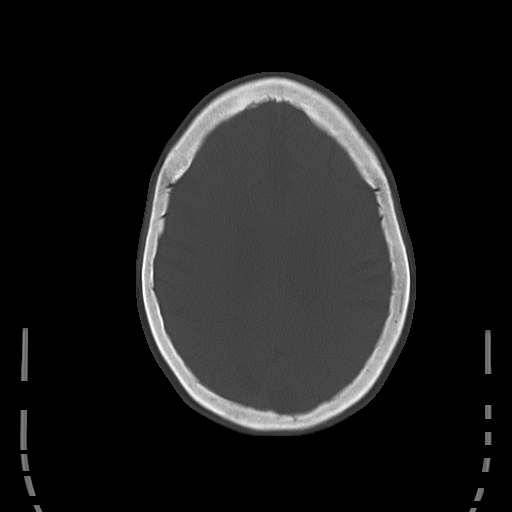
[im 25/31  bone]
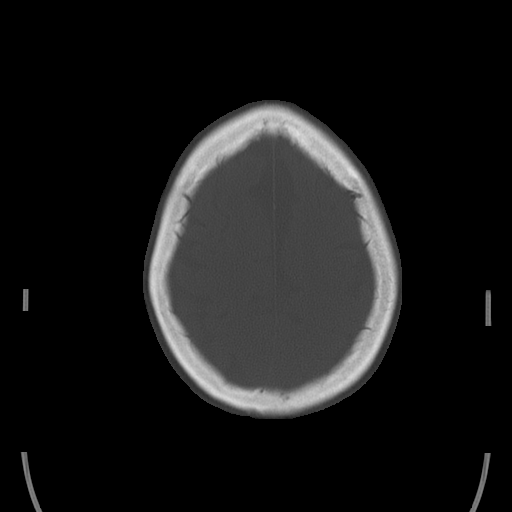

[16 of 30 positions shown; findings below may reference images not displayed]

FINDINGS: Despite efforts by the technologist and patient, motion artifact is
present on today's exam and could not be eliminated. This reduces
exam sensitivity and specificity. The brainstem, cerebellum,
cerebral peduncles, thalamus, basal ganglia, basilar cisterns, and
ventricular system appear within normal limits. Periventricular
white matter and corona radiata hypodensities favor chronic ischemic
microvascular white matter disease. No intracranial hemorrhage, mass
lesion, or acute CVA. Visualized paranasal sinuses appear clear.
IMPRESSION: 1. No acute intracranial findings.
2. Periventricular white matter and corona radiata hypodensities
favor chronic ischemic microvascular white matter disease.

## 2015-08-10 IMAGING — CR DG CHEST 2V
2 series · 2 of 2 positions shown · non-contrast
Comparison: Chest x-ray 10/23/2013 and 12/13/2008.

CLINICAL DATA: Atrial fibrillation.  Shortness of breath.

EXAM:
CHEST  2 VIEW

[w chest lat]
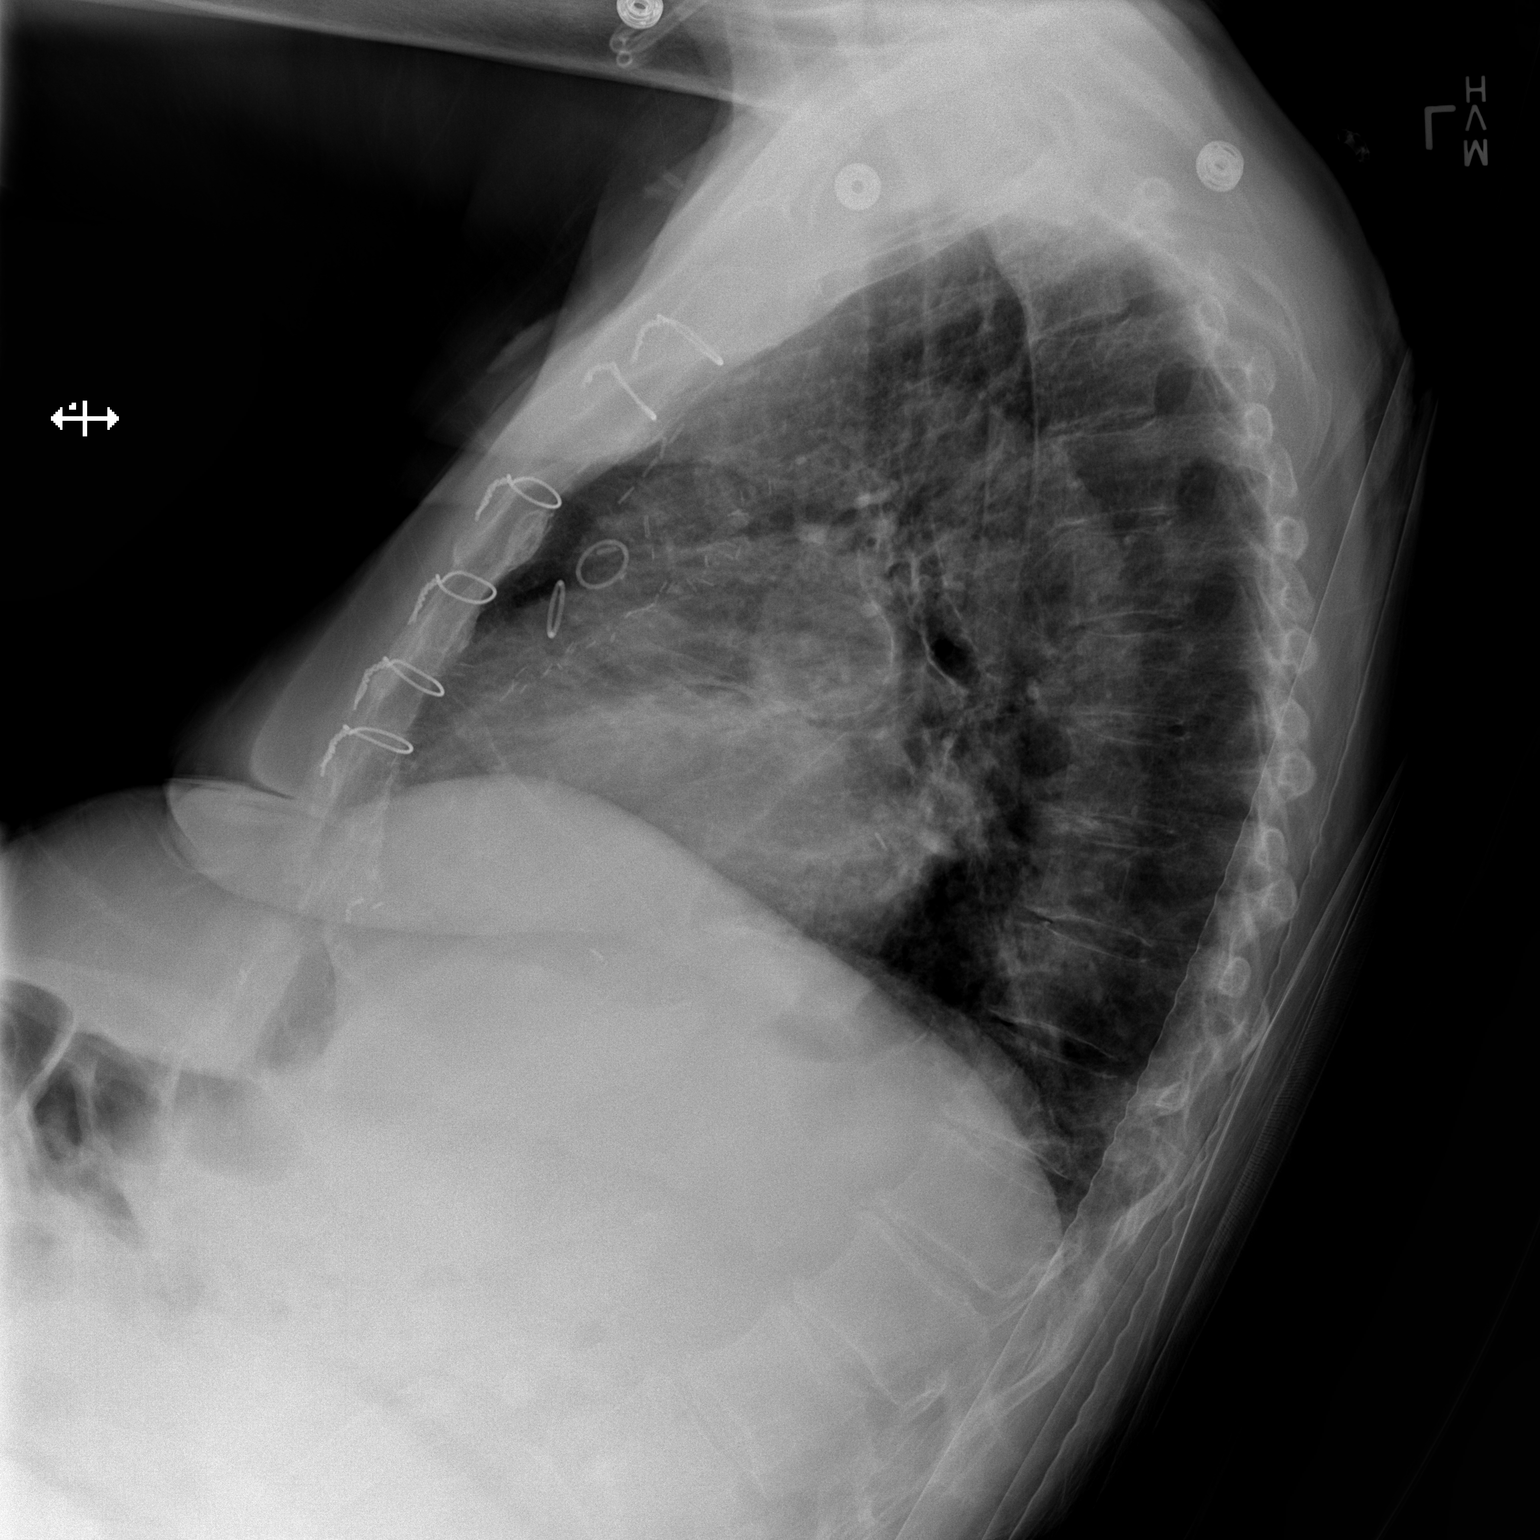

[x chest ap]
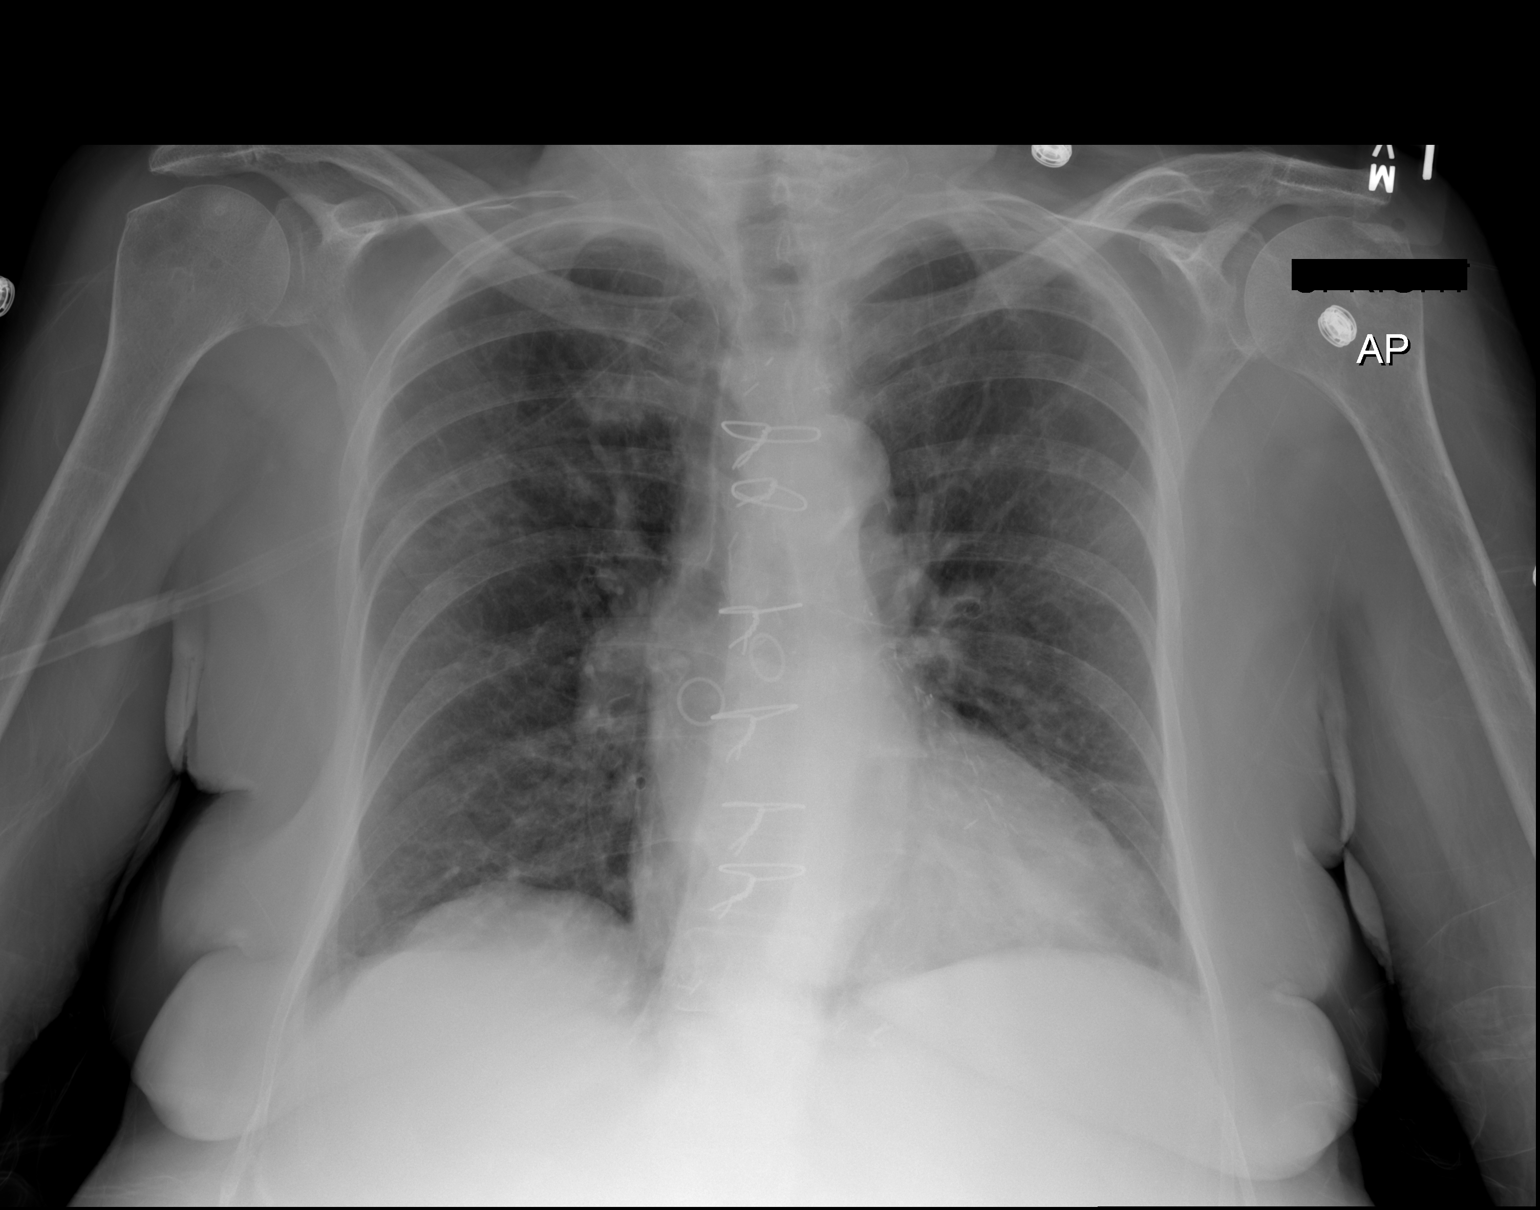

[2 of 2 positions shown; findings below may reference images not displayed]

FINDINGS: Previously identified regions of discoid atelectasis and/or
infiltrates and have largely cleared with minimal residual. A
questionable density is present in the upper aspect of the right
upper lobe. Follow-up chest x-ray recommended demonstrate complete
clearing of the mild residual basilar atelectatic changes and of the
a questionable infiltrate or focal lesion in the right upper lobe.
Heart size and pulmonary vascularity normal. No pleural effusion or
pneumothorax. No acute bony abnormality.
IMPRESSION: 1. Interval near-complete clearing of basilar atelectasis and/or
infiltrates. There is a questionable infiltrate versus focal
pulmonary lesion in the right upper lobe. Continued follow-up chest
x-ray is recommended to demonstrate clearing of the right upper lobe
and minimal residual basilar atelectasis/infiltrates .

2. Prior CABG.  No evidence of CHF.
